# Patient Record
Sex: Female | Born: 1965 | Race: Black or African American | Hispanic: No | Marital: Single | Smoking: Never smoker
Health system: Southern US, Community
[De-identification: ages and names within clinical notes are randomized; demographics above are authoritative.]

## PROBLEM LIST (undated history)

## (undated) DIAGNOSIS — D509 Iron deficiency anemia, unspecified: Secondary | ICD-10-CM

## (undated) DIAGNOSIS — K644 Residual hemorrhoidal skin tags: Secondary | ICD-10-CM

## (undated) DIAGNOSIS — Z9889 Other specified postprocedural states: Secondary | ICD-10-CM

## (undated) DIAGNOSIS — D500 Iron deficiency anemia secondary to blood loss (chronic): Secondary | ICD-10-CM

## (undated) DIAGNOSIS — B9681 Helicobacter pylori [H. pylori] as the cause of diseases classified elsewhere: Secondary | ICD-10-CM

## (undated) DIAGNOSIS — K921 Melena: Secondary | ICD-10-CM

## (undated) DIAGNOSIS — K449 Diaphragmatic hernia without obstruction or gangrene: Secondary | ICD-10-CM

## (undated) DIAGNOSIS — K2950 Unspecified chronic gastritis without bleeding: Secondary | ICD-10-CM

## (undated) HISTORY — DX: Diaphragmatic hernia without obstruction or gangrene: K44.9

## (undated) HISTORY — DX: Iron deficiency anemia, unspecified: D50.9

## (undated) HISTORY — PX: OTHER SURGICAL HISTORY: SHX169

## (undated) HISTORY — DX: Other specified postprocedural states: Z98.890

## (undated) HISTORY — PX: ESOPHAGOGASTRODUODENOSCOPY: SHX1529

---

## 1997-08-31 ENCOUNTER — Ambulatory Visit (HOSPITAL_COMMUNITY): Admission: RE | Admit: 1997-08-31 | Discharge: 1997-08-31 | Payer: Self-pay | Admitting: Gastroenterology

## 1997-11-23 ENCOUNTER — Ambulatory Visit (HOSPITAL_COMMUNITY): Admission: RE | Admit: 1997-11-23 | Discharge: 1997-11-23 | Payer: Self-pay

## 2003-03-16 ENCOUNTER — Ambulatory Visit (HOSPITAL_COMMUNITY): Admission: RE | Admit: 2003-03-16 | Discharge: 2003-03-16 | Payer: Self-pay | Admitting: Internal Medicine

## 2003-03-16 ENCOUNTER — Encounter: Payer: Self-pay | Admitting: Internal Medicine

## 2004-03-26 ENCOUNTER — Ambulatory Visit (HOSPITAL_COMMUNITY): Admission: RE | Admit: 2004-03-26 | Discharge: 2004-03-26 | Payer: Self-pay | Admitting: Family Medicine

## 2004-04-10 ENCOUNTER — Ambulatory Visit (HOSPITAL_COMMUNITY): Admission: RE | Admit: 2004-04-10 | Discharge: 2004-04-10 | Payer: Self-pay | Admitting: Family Medicine

## 2005-04-28 ENCOUNTER — Ambulatory Visit (HOSPITAL_COMMUNITY): Admission: RE | Admit: 2005-04-28 | Discharge: 2005-04-28 | Payer: Self-pay | Admitting: Family Medicine

## 2006-05-08 ENCOUNTER — Ambulatory Visit (HOSPITAL_COMMUNITY): Admission: RE | Admit: 2006-05-08 | Discharge: 2006-05-08 | Payer: Self-pay | Admitting: Family Medicine

## 2006-05-25 ENCOUNTER — Encounter: Admission: RE | Admit: 2006-05-25 | Discharge: 2006-05-25 | Payer: Self-pay | Admitting: Family Medicine

## 2006-05-26 HISTORY — PX: COLONOSCOPY: SHX174

## 2007-04-26 DIAGNOSIS — Z9889 Other specified postprocedural states: Secondary | ICD-10-CM

## 2007-04-26 HISTORY — DX: Other specified postprocedural states: Z98.890

## 2007-04-28 ENCOUNTER — Ambulatory Visit (HOSPITAL_COMMUNITY): Admission: RE | Admit: 2007-04-28 | Discharge: 2007-04-28 | Payer: Self-pay | Admitting: Gastroenterology

## 2008-05-10 ENCOUNTER — Ambulatory Visit (HOSPITAL_COMMUNITY): Admission: RE | Admit: 2008-05-10 | Discharge: 2008-05-10 | Payer: Self-pay | Admitting: Family Medicine

## 2010-06-16 ENCOUNTER — Encounter: Payer: Self-pay | Admitting: Family Medicine

## 2010-10-08 NOTE — Op Note (Signed)
NAMEMARGERT, [name]            ACCOUNT NO.:  [id number]   MEDICAL RECORD NO.:  [id number]          PATIENT TYPE:  AMB   LOCATION:  ENDO                         FACILITY:  [facility]   PHYSICIAN:  Anselmo Rod, M.D.  DATE OF BIRTH:  [birth year]   DATE OF PROCEDURE:  04/28/2007  DATE OF DISCHARGE:  04/28/2007                               OPERATIVE REPORT   PROCEDURE PERFORMED:  Screening colonoscopy.   ENDOSCOPIST:  Anselmo Rod, MD   INSTRUMENT USED:  Pentax video colonoscope.   INDICATIONS FOR PROCEDURE:  A 45 year old [place] American female with a  history of rectal bleeding, undergoing a screening colonoscopy to rule  out colonic polyps, masses, etc.  The patient has a history of  hemorrhoids and chronic constipation.   PREPROCEDURE PREPARATION:  Informed consent was procured from the  patient.  The patient was fasted for 8 hours prior to the procedure and  prepped with a bottle of magnesium citrate and a gallon of NuLytely the  night prior to the procedure.  The risks and benefits of the procedure  including a 10% miss rate of cancer and polyp were discussed with the  patient as well.   PREPROCEDURE PHYSICAL:  VITAL SIGNS:  The patient had stable vital  signs.  NECK:  Supple.  CHEST:  Clear to auscultation.  S1 and S2 regular.  ABDOMEN:  Soft with normal bowel sounds.   DESCRIPTION OF PROCEDURE:  The patient was placed in the left lateral  decubitus position and sedated with 100 mcg of Fentanyl and 10 mg of  Versed given intravenously in slow incremental doses.  Once the patient  was adequately sedated and maintained on low-flow oxygen and continuous  cardiac monitoring, the Pentax video colonoscope was advanced from the  rectum to the cecum.  Multiple washes were done.  The patient had an  excellent prep.  No masses, polyps, erosions, ulcerations or diverticula  were seen.  The appendiceal orifice and cecal valve were clearly  visualized and photographed.   Retroflexion in the rectum revealed  inflamed internal hemorrhoids; small external hemorrhoids were seen as  well.  The patient tolerated the procedure well without complications.   IMPRESSION:  Normal colonoscopy of the terminal ileum, except for small  internal and external hemorrhoids, which I suspect is the source of the  patient's rectal bleeding.   RECOMMENDATIONS:  1. Anusol suppositories have been called into the patient's pharmacy      and Enalapril cream has been given to her to apply around the anus      3 times a day.  Further recommendation to be made in followup in      the next 4 weeks.  2. A high-fiber diet with liberal fluid intake has been advocated.  3. Use of stool softeners and MiraLax is advised as needed.  4. Further recommendations to be made in followup.      Anselmo Rod, M.D.  Electronically Signed     JNM/MEDQ  D:  04/30/2007  T:  04/30/2007  Job:  [id number]   cc:   Della Goo, M.D.  Fax: [phone]

## 2011-02-14 ENCOUNTER — Emergency Department (HOSPITAL_COMMUNITY): Payer: Self-pay

## 2011-02-14 ENCOUNTER — Inpatient Hospital Stay (HOSPITAL_COMMUNITY)
Admission: EM | Admit: 2011-02-14 | Discharge: 2011-02-16 | DRG: 812 | Disposition: A | Discharge status: Home / Self Care | Payer: Self-pay | Attending: Internal Medicine | Admitting: Internal Medicine

## 2011-02-14 ENCOUNTER — Encounter: Payer: Self-pay | Admitting: *Deleted

## 2011-02-14 DIAGNOSIS — K644 Residual hemorrhoidal skin tags: Secondary | ICD-10-CM | POA: Diagnosis present

## 2011-02-14 DIAGNOSIS — D649 Anemia, unspecified: Secondary | ICD-10-CM

## 2011-02-14 DIAGNOSIS — E669 Obesity, unspecified: Secondary | ICD-10-CM

## 2011-02-14 DIAGNOSIS — K648 Other hemorrhoids: Secondary | ICD-10-CM | POA: Diagnosis present

## 2011-02-14 DIAGNOSIS — J45909 Unspecified asthma, uncomplicated: Secondary | ICD-10-CM | POA: Diagnosis not present

## 2011-02-14 DIAGNOSIS — Z791 Long term (current) use of non-steroidal anti-inflammatories (NSAID): Secondary | ICD-10-CM

## 2011-02-14 DIAGNOSIS — D500 Iron deficiency anemia secondary to blood loss (chronic): Principal | ICD-10-CM | POA: Diagnosis present

## 2011-02-14 DIAGNOSIS — K922 Gastrointestinal hemorrhage, unspecified: Secondary | ICD-10-CM

## 2011-02-14 LAB — APTT: aPTT: 40 seconds — ABNORMAL HIGH (ref 24–37)

## 2011-02-14 LAB — HEPATIC FUNCTION PANEL
ALT: 7 U/L (ref 0–35)
Albumin: 3.8 g/dL (ref 3.5–5.2)
Bilirubin, Direct: 0.1 mg/dL (ref 0.0–0.3)
Indirect Bilirubin: 0.1 mg/dL — ABNORMAL LOW (ref 0.3–0.9)
Total Protein: 7.2 g/dL (ref 6.0–8.3)

## 2011-02-14 LAB — BASIC METABOLIC PANEL
BUN: 7 mg/dL (ref 6–23)
CO2: 24 mEq/L (ref 19–32)
Chloride: 106 mEq/L (ref 96–112)
Creatinine, Ser: 0.87 mg/dL (ref 0.50–1.10)

## 2011-02-14 LAB — RETICULOCYTES: Retic Count, Absolute: 46.6 10*3/uL (ref 19.0–186.0)

## 2011-02-14 LAB — CBC
HCT: 17.4 % — ABNORMAL LOW (ref 36.0–46.0)
MCH: 15 pg — ABNORMAL LOW (ref 26.0–34.0)
MCV: 55.4 fL — ABNORMAL LOW (ref 78.0–100.0)
Platelets: 280 10*3/uL (ref 150–400)
RBC: 3.14 MIL/uL — ABNORMAL LOW (ref 3.87–5.11)
RDW: 20.8 % — ABNORMAL HIGH (ref 11.5–15.5)
WBC: 5.4 10*3/uL (ref 4.0–10.5)

## 2011-02-14 LAB — DIFFERENTIAL
Eosinophils Relative: 2 % (ref 0–5)
Monocytes Absolute: 0.3 10*3/uL (ref 0.1–1.0)

## 2011-02-14 LAB — OCCULT BLOOD, POC DEVICE: Fecal Occult Bld: NEGATIVE

## 2011-02-14 MED ORDER — ONDANSETRON HCL 4 MG/2ML IJ SOLN: 4.0000 mg | Freq: Four times a day (QID) | INTRAMUSCULAR | Status: DC | PRN

## 2011-02-14 MED ORDER — BISACODYL 10 MG RE SUPP: 10.0000 mg | RECTAL | Status: DC | PRN

## 2011-02-14 MED ORDER — ACETAMINOPHEN 650 MG RE SUPP: 650.0000 mg | Freq: Four times a day (QID) | RECTAL | Status: DC | PRN

## 2011-02-14 MED ORDER — ACETAMINOPHEN 325 MG PO TABS
650.0000 mg | ORAL_TABLET | Freq: Four times a day (QID) | ORAL | Status: DC | PRN
  Administered 2011-02-15 – 2011-02-16 (×3): 650 mg via ORAL
  Filled 2011-02-14 (×3): qty 2

## 2011-02-14 MED ORDER — FLEET ENEMA 7-19 GM/118ML RE ENEM: 1.0000 | ENEMA | RECTAL | Status: DC | PRN

## 2011-02-14 MED ORDER — ONDANSETRON HCL 4 MG PO TABS: 4.0000 mg | ORAL_TABLET | Freq: Four times a day (QID) | ORAL | Status: DC | PRN

## 2011-02-14 MED ORDER — TRAZODONE HCL 50 MG PO TABS
25.0000 mg | ORAL_TABLET | Freq: Every evening | ORAL | Status: DC | PRN
  Filled 2011-02-14: qty 1

## 2011-02-14 MED ORDER — SODIUM CHLORIDE 0.9 % IV SOLN: INTRAVENOUS | Status: DC

## 2011-02-14 MED ORDER — ASPIRIN EC 81 MG PO TBEC
81.0000 mg | DELAYED_RELEASE_TABLET | Freq: Every day | ORAL | Status: DC
  Administered 2011-02-15 – 2011-02-16 (×2): 81 mg via ORAL
  Filled 2011-02-14 (×2): qty 1

## 2011-02-14 MED ORDER — POLYETHYLENE GLYCOL 3350 17 G PO PACK: 17.0000 g | PACK | Freq: Every day | ORAL | Status: DC | PRN

## 2011-02-14 NOTE — ED Notes (Signed)
Critical hemoglobin 4.3 called in by [facility] in lab.

## 2011-02-14 NOTE — H&P (Signed)
PCP:   No primary provider on file.   Chief Complaint:  Episodic rectal bleeding and pain.  HPI: 45 year old obese African American lady, who has high episodic rectal bleeding for over 3 years, who had a colonoscopy with Dr. Loreta Ave 3 years ago, and was reportedly told that it was on the piles but in fact did not go back for followup, because of financial reasons. He does not take iron supplements, but does take some type of pain medication at least once per week, typically ibuprofen, Aleve or Goody's powders.  She has never noted black or tarry stools; she has not menstruated for about 4 years; she has had progressive dyspnea on exertion but no leg edema no chest pains or palpitations.  In the emergency room patient was evaluated and found her hemoglobin is 4.7 , and the hospitalist service was called to assist with management.  Review of Systems:  The patient denies anorexia, fever, weight loss,, vision loss, decreased hearing, hoarseness, chest pain, syncope, , peripheral edema, balance deficits, hemoptysis, abdominal pain, melena, hematochezia, severe indigestion/heartburn, hematuria, incontinence, genital sores, muscle weakness, suspicious skin lesions, transient blindness, difficulty walking, depression, unusual weight change,  enlarged lymph nodes, angioedema, and breast masses.  Past Medical History: Past Medical History  Diagnosis Date  . Asthma    History reviewed. No pertinent past surgical history.  Medications: Prior to Admission medications   Medication Sig Start Date End Date Taking? Authorizing Provider  Aspirin-Caffeine (BC FAST PAIN RELIEF PO) Take 1 packet by mouth every 4 (four) hours as needed. Pain    Yes Historical Provider, MD  diphenhydramine-acetaminophen (TYLENOL PM) 25-500 MG TABS Take 1 tablet by mouth at bedtime as needed. Sleep/Pain    Yes Historical Provider, MD  hydrocortisone (PREPARATION H HYDROCORTISONE) 1 % cream Apply 1 application topically 2 (two)  times daily as needed. Pain    Yes Historical Provider, MD  shark liver oil-cocoa butter (HEMORRHOIDAL) 0.25-3-85.5 % suppository Place 1 suppository rectally as needed. Hemorrhoidal Pain    Yes Historical Provider, MD    Allergies:   Allergies  Allergen Reactions  . Penicillins     Social History:  reports that she has never smoked. She does not have any smokeless tobacco history on file. She reports that she does not drink alcohol or use illicit drugs.  Family History: History reviewed. No pertinent family history.  Physical Exam: Filed Vitals:   02/14/11 1430 02/14/11 1434 02/14/11 1818  BP:  139/68 122/51  Pulse:  96 71  Temp:  98.7 F (37.1 C)   TempSrc:  Oral   Resp:  20 16  Height: 5\' 10"  (1.778 m)    Weight: 108.863 kg (240 lb)    SpO2:  100% 98%    Pleasant young-looking African American lady lying in bed does not appear acutely distressed Gross membranes are markedly pale, anicteric, pupils round equal and reactive, extraocular muscles intact No thyromegaly or carotid bruit no cervical lymphadenopathy Chest is clear to auscultation bilaterally, 2/6 systolic murmur regular rhythm No chest wall tenderness Abdomen is obese soft nontender no masses Extremities she has genu valgus otherwise unremarkable Skin is free from blemishes or bruising. Central nervous system grossly intact no lateralizing signs   Labs on Admission:   [facility] 02/14/11 1711  NA 138  K 3.8  CL 106  CO2 24  GLUCOSE 97  BUN 7  CREATININE 0.87  CALCIUM 9.2  MG --  PHOS --     Basename 02/14/11 1846 02/14/11 1711  WBC -- 5.4  NEUTROABS -- 3.1  HGB 4.3* 4.7*  HCT 16.0* 17.4*  MCV -- 55.4*  PLT -- 280   No results found for this basename: CKTOTAL:3,CKMB:3,CKMBINDEX:3,TROPONINI:3 in the last 72 hours No results found for this basename: TSH,T4TOTAL,FREET3,T3FREE,THYROIDAB in the last 72 hours  Basename 02/14/11 2107  VITAMINB12 --  FOLATE --  FERRITIN --  TIBC --  IRON --    RETICCTPCT 1.6    Radiological Exams on Admission: No results found.  Pending  Assessment/Plan Present on Admission:  .Anemia .Obesity  Severe microcytic anemia without marked symptoms suggest a chronic ongoing bleed likely the result of her chronic rectal bleeding. He possibly be aggravated by herNSAID use.  Will admit for transfusion of 5 units of packed red cells after anemia workup and consult gastroenterologist for assistance with management.  Other plans as per orders.   Eldredge Veldhuizen 02/14/2011, 10:08 PM

## 2011-02-14 NOTE — ED Notes (Signed)
Pt c/o vaginal bleeding, rectal swelling and rectal pain x 2 weeks. Pt states that she has hemorrhoids and has been using OTC medicine but it has not helped. Painful to pass stool.

## 2011-02-14 NOTE — ED Notes (Signed)
Critical Hgb called over of 14.7. Dr. Fredricka Bonine made aware. New order received to recheck Hgb.

## 2011-02-14 NOTE — ED Provider Notes (Signed)
History   Scribed for Felisa Bonier, MD, the patient was seen in room APA07/APA07. This chart was scribed by Clarita Crane. This patient's care was started at 4:42PM.  CSN: [id number] Arrival date & time: 02/14/2011  2:58 PM  Chief Complaint  Patient presents with  . Rectal Pain   HPI   HPI Traci [name] is a 45 y.o. female who presents to the Emergency Department complaining of rectal pain onset yesterday after administration of suppository and persistent since with associated weakness since yesterday which has improved today. Describes pain as "like a knot" in her rectum. Patient also notes she has experienced rectal bleeding onset 3 weeks ago and persistent since until resolved 1 week ago after administration of suppository. States bleeding is "like water" from anus. Patient reports having a significant history of hemorrhoids for which she was followed by Dr. Precious Bard for who advised patient to use stool softeners for aid which she has been compliant with. H/o prolapsed uterus.  PCP- Dr. Loreta Ave in [place]   HPI ELEMENTS: Location: rectum  Onset: yesterday Duration: persistent since onset  Timing: constant  Quality: "like a knot" in her rectum    Context:  as above  Associated symptoms: +weakness, rectal bleeding for 2 weeks prior to rectal pain but resolved prior to onset of rectal pain.    PAST MEDICAL HISTORY:  Past Medical History  Diagnosis Date  . Asthma     PAST SURGICAL HISTORY:  History reviewed. No pertinent past surgical history.  FAMILY HISTORY:  History reviewed. No pertinent family history.   SOCIAL HISTORY: History   Social History  . Marital Status: Single    Spouse Name: N/A    Number of Children: N/A  . Years of Education: N/A   Social History Main Topics  . Smoking status: Never Smoker   . Smokeless tobacco: None  . Alcohol Use: No  . Drug Use: No  . Sexually Active: Yes    Birth Control/ Protection: None   Other Topics Concern  . None     Social History Narrative  . None      Review of Systems  Review of Systems 10 Systems reviewed and are negative for acute change except as noted in the HPI.  Allergies  Penicillins  Home Medications   Current Outpatient Rx  Name Route Sig Dispense Refill  . BC FAST PAIN RELIEF PO Oral Take 1 packet by mouth every 4 (four) hours as needed. Pain     . DIPHENHYDRAMINE-APAP (SLEEP) 25-500 MG PO TABS Oral Take 1 tablet by mouth at bedtime as needed. Sleep/Pain     . HYDROCORTISONE 1 % EX CREA Topical Apply 1 application topically 2 (two) times daily as needed. Pain     . PE-SHARK LIVER OIL-COCOA BUTTR 0.25-3-85.5 % RE SUPP Rectal Place 1 suppository rectally as needed. Hemorrhoidal Pain       Physical Exam    BP 122/51  Pulse 71  Temp(Src) 98.7 F (37.1 C) (Oral)  Resp 16  Ht 5\' 10"  (1.778 m)  Wt 240 lb (108.863 kg)  BMI 34.44 kg/m2  SpO2 98%  Physical Exam  Nursing note and vitals reviewed. Constitutional: She is oriented to person, place, and time. She appears well-developed and well-nourished.  HENT:  Head: Normocephalic and atraumatic.       Moist mucous membranes.   Eyes: EOM are normal. Pupils are equal, round, and reactive to light.       Conjunctiva slightly pale.   Neck: Neck  supple.  Cardiovascular: Normal rate, regular rhythm, S1 normal, S2 normal and normal heart sounds.   No murmur heard. Pulmonary/Chest: Effort normal and breath sounds normal. She has no wheezes. She has no rales.  Abdominal: Soft. Bowel sounds are normal. She exhibits no distension. There is no tenderness.  Genitourinary:       Female chaperone present for exam. Inflamed hemorrhoid noted.   Musculoskeletal: Normal range of motion. She exhibits no edema.  Neurological: She is alert and oriented to person, place, and time. No sensory deficit.  Skin: Skin is warm and dry.  Psychiatric: She has a normal mood and affect. Her behavior is normal.    ED Course  Procedures  OTHER DATA  REVIEWED: Nursing notes, vital signs, and past medical records reviewed. Lab results reviewed and considered Imaging results reviewed and considered  DIAGNOSTIC STUDIES:   LABS / RADIOLOGY: Results for orders placed during the hospital encounter of 02/14/11  CBC      Component Value Range   WBC 5.4  4.0 - 10.5 (K/uL)   RBC 3.14 (*) 3.87 - 5.11 (MIL/uL)   Hemoglobin 4.7 (*) 12.0 - 15.0 (g/dL)   HCT 78.2 (*) 95.6 - 46.0 (%)   MCV 55.4 (*) 78.0 - 100.0 (fL)   MCH 15.0 (*) 26.0 - 34.0 (pg)   MCHC 27.0 (*) 30.0 - 36.0 (g/dL)   RDW 21.3 (*) 08.6 - 15.5 (%)   Platelets 280  150 - 400 (K/uL)  DIFFERENTIAL      Component Value Range   Neutrophils Relative 57  43 - 77 (%)   Neutro Abs 3.1  1.7 - 7.7 (K/uL)   Lymphocytes Relative 36  12 - 46 (%)   Lymphs Abs 1.9  0.7 - 4.0 (K/uL)   Monocytes Relative 5  3 - 12 (%)   Monocytes Absolute 0.3  0.1 - 1.0 (K/uL)   Eosinophils Relative 2  0 - 5 (%)   Eosinophils Absolute 0.1  0.0 - 0.7 (K/uL)   Basophils Relative 1  0 - 1 (%)   Basophils Absolute 0.0  0.0 - 0.1 (K/uL)  BASIC METABOLIC PANEL      Component Value Range   Sodium 138  135 - 145 (mEq/L)   Potassium 3.8  3.5 - 5.1 (mEq/L)   Chloride 106  96 - 112 (mEq/L)   CO2 24  19 - 32 (mEq/L)   Glucose, Bld 97  70 - 99 (mg/dL)   BUN 7  6 - 23 (mg/dL)   Creatinine, Ser 5.78  0.50 - 1.10 (mg/dL)   Calcium 9.2  8.4 - 46.9 (mg/dL)   GFR calc non Af Amer >60  >60 (mL/min)   GFR calc Af Amer >60  >60 (mL/min)  PROTIME-INR      Component Value Range   Prothrombin Time 12.9  11.6 - 15.2 (seconds)   INR 0.95  0.00 - 1.49   APTT      Component Value Range   aPTT 40 (*) 24 - 37 (seconds)  OCCULT BLOOD, POC DEVICE      Component Value Range   Fecal Occult Bld NEGATIVE    HEMOGLOBIN AND HEMATOCRIT, BLOOD      Component Value Range   Hemoglobin 4.3 (*) 12.0 - 15.0 (g/dL)   HCT 62.9 (*) 52.8 - 46.0 (%)  TYPE AND SCREEN      Component Value Range   ABO/RH(D) O POS     Antibody Screen NEG       Sample  Expiration 02/17/2011     Unit Number [id number]     Blood Component Type RED CELLS,LR     Unit division 00     Status of Unit ALLOCATED     Transfusion Status OK TO TRANSFUSE     Crossmatch Result Compatible     Unit Number [id number]     Blood Component Type RED CELLS,LR     Unit division 00     Status of Unit ALLOCATED     Transfusion Status OK TO TRANSFUSE     Crossmatch Result Compatible     Unit Number [id number]     Blood Component Type RED CELLS,LR     Unit division 00     Status of Unit ALLOCATED     Transfusion Status OK TO TRANSFUSE     Crossmatch Result Compatible    PREPARE RBC (CROSSMATCH)      Component Value Range   Order Confirmation ORDER PROCESSED BY BLOOD BANK    ABO/RH      Component Value Range   ABO/RH(D) O POS     No results found.  ED COURSE / COORDINATION OF CARE: Orders Placed This Encounter  Procedures  . CBC  . Differential  . Basic metabolic panel  . Protime-INR  . APTT  . Hemoglobin and hematocrit, blood  . Consult to hospitalist  . Occult blood, poc device  . Type and screen  . Prepare RBC  . ABO/Rh  4:58PM- Patient now denies vaginal bleeding noting she misunderstood initial question. Wet prep and Transvaginal US orders cancelled as a result.  6:15PM- Patient informed of critically low hemoglobin level and intent to admit for blood transfusion. Patient agrees with plan set forth at this time. Will obtain a secondary lab result to confirm need for transfusion first. 9:08PM- Consult complete with Hospitalist. Patient case explained and discussed. Hospitalist agrees to admit patient for further evaluation and treatment.   MDM: Differential Diagnosis:   PLAN: Admit  CONDITION ON Admission: Stable  DIAGNOSIS: No diagnosis found.   MEDICATIONS GIVEN IN THE E.D.  Medications  diphenhydramine-acetaminophen (TYLENOL PM) 25-500 MG TABS (not administered)  Aspirin-Caffeine (BC FAST PAIN RELIEF PO) (not administered)   hydrocortisone (PREPARATION H HYDROCORTISONE) 1 % cream (not administered)  shark liver oil-cocoa butter (HEMORRHOIDAL) 0.25-3-85.5 % suppository (not administered)      I personally performed the services described in this documentation, which was scribed in my presence. The recorded information has been reviewed and considered.    Felisa Bonier, MD 03/14/11 [phone]

## 2011-02-14 NOTE — ED Notes (Signed)
Pt to room. Pt stats hemorrhoids are "hanging out". Pt states bleeding is coming form hemorrhoids. NAD at this time.

## 2011-02-14 NOTE — ED Notes (Signed)
Pt taken upstairs, report given to [place], rn

## 2011-02-15 LAB — CBC
MCHC: 29.9 g/dL — ABNORMAL LOW (ref 30.0–36.0)
RDW: 31.9 % — ABNORMAL HIGH (ref 11.5–15.5)

## 2011-02-15 LAB — CARDIAC PANEL(CRET KIN+CKTOT+MB+TROPI): Troponin I: <0.3 ng/mL (ref <0.30)

## 2011-02-15 LAB — BASIC METABOLIC PANEL
BUN: 5 mg/dL — ABNORMAL LOW (ref 6–23)
Calcium: 9 mg/dL (ref 8.4–10.5)
Creatinine, Ser: 0.76 mg/dL (ref 0.50–1.10)
GFR calc Af Amer: >60 mL/min (ref >60)
GFR calc non Af Amer: >60 mL/min (ref >60)

## 2011-02-15 LAB — URINALYSIS, ROUTINE W REFLEX MICROSCOPIC
Bilirubin Urine: NEGATIVE
Hgb urine dipstick: NEGATIVE
Specific Gravity, Urine: 1.01 (ref 1.005–1.030)
Urobilinogen, UA: 0.2 mg/dL (ref 0.0–1.0)
pH: 6 (ref 5.0–8.0)

## 2011-02-15 LAB — IRON AND TIBC: UIBC: 434 ug/dL — ABNORMAL HIGH (ref 125–400)

## 2011-02-15 LAB — PREPARE RBC (CROSSMATCH)

## 2011-02-15 MED ORDER — FUROSEMIDE 10 MG/ML IJ SOLN: 20.0000 mg | INTRAMUSCULAR | Status: DC | PRN

## 2011-02-15 MED ORDER — SODIUM CHLORIDE 0.9 % IJ SOLN
INTRAMUSCULAR | Status: AC
  Filled 2011-02-15: qty 10

## 2011-02-15 NOTE — Progress Notes (Signed)
Subjective: This for a pleasant 45 year old lady came in with severe microcytic anemia with a hemoglobin of 4.7! She apparently felt slightly tired. She has had significant rectal bleeding in the last week or so. She does have a history of internal and external hemorrhoids shown on colonoscopy a few years ago. She denies any nausea or vomiting, in particular no history of melena or hematemesis. She is already had 3 units of blood so far. She is due to have 2 further units today.           Physical Exam: Blood pressure 109/65 pulse 71, temperature 97.8 F (36.6 C), temperature source Oral, resp. rate 17, height 5\' 10"  (1.778 m), weight 111 kg (244 lb 11.4 oz), SpO2 99.00%. She looks systemically well and despite the blood computerized pressure recordings, her blood pressure is at acceptable range and she is hemodynamically stable. She is alert and orientated. Heart sounds are present and normal. Lung fields are clear. She is not clinically shock.   Investigations:  Basename 02/14/11 1846 02/14/11 1711  WBC -- 5.4  NEUTROABS -- 3.1  HGB 4.3* 4.7*  HCT 16.0* 17.4*  MCV -- 55.4*  PLT -- 280    Basename 02/14/11 1711  NA 138  K 3.8  CL 106  CO2 24  GLUCOSE 97  BUN 7  CREATININE 0.87  CALCIUM 9.2  MG --  PHOS --   Recent Results (from the past 240 hour(s))  MRSA PCR SCREENING     Status: Normal   Collection Time   02/15/11 12:35 AM      Component Value Range Status Comment   MRSA by PCR NEGATIVE  NEGATIVE  Final     X-ray Chest Pa And Lateral   02/15/2011  *RADIOLOGY REPORT*  Clinical Data: Severe anemia.  CHEST - 2 VIEW  Comparison: None.  Findings: The lungs are well-aerated; there is mild elevation of the left hemidiaphragm.  There is no evidence of focal opacification, pleural effusion or pneumothorax.  The heart is borderline normal in size; the mediastinal contour is within normal limits.  No acute osseous abnormalities are seen.  IMPRESSION: No acute cardiopulmonary  process seen; mild elevation of the left hemidiaphragm, likely transient in nature.  Original Report Authenticated By: Tonia Ghent, M.D.      Medications: I have reviewed the patient's current medications.  Impression: 1. Acute GI bleed secondary to hemorrhoids. 2. Severe microcytic anemia secondary to #1, status post 3 units blood transfusion so far.     Plan: 1. Continue with plan of further 2 units of blood transfusion. 2. Await gastroenterology consultation.     LOS: 1 day   GOSRANI,NIMISH C 02/15/2011, 9:34 AM

## 2011-02-16 LAB — TYPE AND SCREEN
Unit division: 0
Unit division: 0
Unit division: 0

## 2011-02-16 LAB — CBC
Platelets: 293 10*3/uL (ref 150–400)
RDW: 31.1 % — ABNORMAL HIGH (ref 11.5–15.5)
WBC: 7 10*3/uL (ref 4.0–10.5)

## 2011-02-16 LAB — COMPREHENSIVE METABOLIC PANEL
AST: 9 U/L (ref 0–37)
Albumin: 3.5 g/dL (ref 3.5–5.2)
Chloride: 106 mEq/L (ref 96–112)
Creatinine, Ser: 0.75 mg/dL (ref 0.50–1.10)
Potassium: 3.7 mEq/L (ref 3.5–5.1)
Total Bilirubin: 0.5 mg/dL (ref 0.3–1.2)
Total Protein: 6.8 g/dL (ref 6.0–8.3)

## 2011-02-16 LAB — URINE CULTURE
Colony Count: 4000
Culture  Setup Time: 201209222030

## 2011-02-16 MED ORDER — FERROUS SULFATE 325 (65 FE) MG PO TBEC: 325.0000 mg | DELAYED_RELEASE_TABLET | Freq: Two times a day (BID) | ORAL | Status: DC

## 2011-02-16 MED ORDER — LOPERAMIDE HCL 2 MG PO CAPS
2.0000 mg | ORAL_CAPSULE | ORAL | Status: DC | PRN
  Administered 2011-02-16: 2 mg via ORAL
  Filled 2011-02-16: qty 1

## 2011-02-16 MED ORDER — DOCUSATE SODIUM 100 MG PO CAPS: 100.0000 mg | ORAL_CAPSULE | Freq: Two times a day (BID) | ORAL | Status: AC

## 2011-02-16 NOTE — Discharge Summary (Signed)
Physician Discharge Summary  Patient ID: Traci [name] MRN: [id number] DOB/AGE: [birth year] 45 y.o.  Admit date: 02/14/2011 Discharge date: 02/16/2011    Discharge Diagnoses:  1. Chronic gastrointestinal blood loss microcytic anemia, status post 5 units blood transfusion. No active bleeding. 2. Asthma, stable.   Current Discharge Medication List    START taking these medications   Details  docusate sodium (COLACE) 100 MG capsule Take 1 capsule (100 mg total) by mouth 2 (two) times daily. Qty: 60 capsule, Refills: 0    ferrous sulfate 325 (65 FE) MG EC tablet Take 1 tablet (325 mg total) by mouth 2 (two) times daily with a meal. Qty: 90 tablet, Refills: 0      CONTINUE these medications which have NOT CHANGED   Details  diphenhydramine-acetaminophen (TYLENOL PM) 25-500 MG TABS Take 1 tablet by mouth at bedtime as needed. Sleep/Pain     hydrocortisone (PREPARATION H HYDROCORTISONE) 1 % cream Apply 1 application topically 2 (two) times daily as needed. Pain     shark liver oil-cocoa butter (HEMORRHOIDAL) 0.25-3-85.5 % suppository Place 1 suppository rectally as needed. Hemorrhoidal Pain       STOP taking these medications     Aspirin-Caffeine (BC FAST PAIN RELIEF PO)         Discharged Condition: Improved and stable.    Consults: None.  Significant Diagnostic Studies: X-ray Chest Pa And Lateral   02/15/2011  *RADIOLOGY REPORT*  Clinical Data: Severe anemia.  CHEST - 2 VIEW  Comparison: None.  Findings: The lungs are well-aerated; there is mild elevation of the left hemidiaphragm.  There is no evidence of focal opacification, pleural effusion or pneumothorax.  The heart is borderline normal in size; the mediastinal contour is within normal limits.  No acute osseous abnormalities are seen.  IMPRESSION: No acute cardiopulmonary process seen; mild elevation of the left hemidiaphragm, likely transient in nature.  Original Report Authenticated By: Tonia Ghent, M.D.    Lab  Results: Results for orders placed during the hospital encounter of 02/14/11 (from the past 48 hour(s))  OCCULT BLOOD, POC DEVICE     Status: Normal   Collection Time   02/14/11  5:08 PM      Component Value Range Comment   Fecal Occult Bld NEGATIVE     CBC     Status: Abnormal   Collection Time   02/14/11  5:11 PM      Component Value Range Comment   WBC 5.4  4.0 - 10.5 (K/uL)    RBC 3.14 (*) 3.87 - 5.11 (MIL/uL)    Hemoglobin 4.7 (*) 12.0 - 15.0 (g/dL)    HCT 40.9 (*) 81.1 - 46.0 (%)    MCV 55.4 (*) 78.0 - 100.0 (fL)    MCH 15.0 (*) 26.0 - 34.0 (pg)    MCHC 27.0 (*) 30.0 - 36.0 (g/dL)    RDW 91.4 (*) 78.2 - 15.5 (%)    Platelets 280  150 - 400 (K/uL)   DIFFERENTIAL     Status: Normal   Collection Time   02/14/11  5:11 PM      Component Value Range Comment   Neutrophils Relative 57  43 - 77 (%)    Neutro Abs 3.1  1.7 - 7.7 (K/uL)    Lymphocytes Relative 36  12 - 46 (%)    Lymphs Abs 1.9  0.7 - 4.0 (K/uL)    Monocytes Relative 5  3 - 12 (%)    Monocytes Absolute 0.3  0.1 - 1.0 (K/uL)  Eosinophils Relative 2  0 - 5 (%)    Eosinophils Absolute 0.1  0.0 - 0.7 (K/uL)    Basophils Relative 1  0 - 1 (%)    Basophils Absolute 0.0  0.0 - 0.1 (K/uL)   BASIC METABOLIC PANEL     Status: Normal   Collection Time   02/14/11  5:11 PM      Component Value Range Comment   Sodium 138  135 - 145 (mEq/L)    Potassium 3.8  3.5 - 5.1 (mEq/L)    Chloride 106  96 - 112 (mEq/L)    CO2 24  19 - 32 (mEq/L)    Glucose, Bld 97  70 - 99 (mg/dL)    BUN 7  6 - 23 (mg/dL)    Creatinine, Ser 4.54  0.50 - 1.10 (mg/dL)    Calcium 9.2  8.4 - 10.5 (mg/dL)    GFR calc non Af Amer >60  >60 (mL/min)    GFR calc Af Amer >60  >60 (mL/min)   PROTIME-INR     Status: Normal   Collection Time   02/14/11  5:11 PM      Component Value Range Comment   Prothrombin Time 12.9  11.6 - 15.2 (seconds)    INR 0.95  0.00 - 1.49    APTT     Status: Abnormal   Collection Time   02/14/11  5:11 PM      Component Value Range  Comment   aPTT 40 (*) 24 - 37 (seconds)   HEPATIC FUNCTION PANEL     Status: Abnormal   Collection Time   02/14/11  5:11 PM      Component Value Range Comment   Total Protein 7.2  6.0 - 8.3 (g/dL)    Albumin 3.8  3.5 - 5.2 (g/dL)    AST 11  0 - 37 (U/L)    ALT 7  0 - 35 (U/L)    Alkaline Phosphatase 62  39 - 117 (U/L)    Total Bilirubin 0.2 (*) 0.3 - 1.2 (mg/dL)    Bilirubin, Direct 0.1  0.0 - 0.3 (mg/dL)    Indirect Bilirubin 0.1 (*) 0.3 - 0.9 (mg/dL)   HEMOGLOBIN AND HEMATOCRIT, BLOOD     Status: Abnormal   Collection Time   02/14/11  6:46 PM      Component Value Range Comment   Hemoglobin 4.3 (*) 12.0 - 15.0 (g/dL)    HCT 09.8 (*) 11.9 - 46.0 (%)   ABO/RH     Status: Normal   Collection Time   02/14/11  6:46 PM      Component Value Range Comment   ABO/RH(D) O POS     TYPE AND SCREEN     Status: Normal   Collection Time   02/14/11  7:18 PM      Component Value Range Comment   ABO/RH(D) O POS      Antibody Screen NEG      Sample Expiration 02/17/2011      Unit Number [id number]      Blood Component Type RED CELLS,LR      Unit division 00      Status of Unit ISSUED,FINAL      Transfusion Status OK TO TRANSFUSE      Crossmatch Result Compatible      Unit Number [id number]      Blood Component Type RED CELLS,LR      Unit division 00      Status of Unit ISSUED,FINAL  Transfusion Status OK TO TRANSFUSE      Crossmatch Result Compatible      Unit Number [id number]      Blood Component Type RED CELLS,LR      Unit division 00      Status of Unit ISSUED,FINAL      Transfusion Status OK TO TRANSFUSE      Crossmatch Result Compatible      Unit Number [id number]      Blood Component Type RED CELLS,LR      Unit division 00      Status of Unit ISSUED,FINAL      Transfusion Status OK TO TRANSFUSE      Crossmatch Result Compatible      Unit Number [id number]      Blood Component Type RED CELLS,LR      Unit division 00      Status of Unit ISSUED,FINAL      Transfusion Status OK  TO TRANSFUSE      Crossmatch Result Compatible     PREPARE RBC (CROSSMATCH)     Status: Normal   Collection Time   02/14/11  7:19 PM      Component Value Range Comment   Order Confirmation ORDER PROCESSED BY BLOOD BANK     VITAMIN B12     Status: Normal   Collection Time   02/14/11  9:07 PM      Component Value Range Comment   Vitamin B-12 348  211 - 911 (pg/mL)   FOLATE     Status: Normal   Collection Time   02/14/11  9:07 PM      Component Value Range Comment   Folate 11.5     IRON AND TIBC     Status: Abnormal   Collection Time   02/14/11  9:07 PM      Component Value Range Comment   Iron <10 (*) 42 - 135 (ug/dL)    TIBC Not calculated due to Iron <10.  250 - 470 (ug/dL)    Saturation Ratios Not calculated due to Iron <10.  20 - 55 (%)    UIBC 434 (*) 125 - 400 (ug/dL)   FERRITIN     Status: Abnormal   Collection Time   02/14/11  9:07 PM      Component Value Range Comment   Ferritin 1 (*) 10 - 291 (ng/mL)   RETICULOCYTES     Status: Abnormal   Collection Time   02/14/11  9:07 PM      Component Value Range Comment   Retic Ct Pct 1.6  0.4 - 3.1 (%)    RBC. 2.91 (*) 3.87 - 5.11 (MIL/uL)    Retic Count, Manual 46.6  19.0 - 186.0 (K/uL)   CARDIAC PANEL(CRET KIN+CKTOT+MB+TROPI)     Status: Normal   Collection Time   02/14/11 10:59 PM      Component Value Range Comment   Total CK 121  7 - 177 (U/L)    CK, MB 2.2  0.3 - 4.0 (ng/mL)    Troponin I <0.30  <0.30 (ng/mL)    Relative Index 1.8  0.0 - 2.5    MRSA PCR SCREENING     Status: Normal   Collection Time   02/15/11 12:35 AM      Component Value Range Comment   MRSA by PCR NEGATIVE  NEGATIVE    URINALYSIS, ROUTINE W REFLEX MICROSCOPIC     Status: Normal   Collection Time   02/15/11  1:00 AM  Component Value Range Comment   Color, Urine YELLOW  YELLOW     Appearance CLEAR  CLEAR     Specific Gravity, Urine 1.010  1.005 - 1.030     pH 6.0  5.0 - 8.0     Glucose, UA NEGATIVE  NEGATIVE (mg/dL)    Hgb urine dipstick  NEGATIVE  NEGATIVE     Bilirubin Urine NEGATIVE  NEGATIVE     Ketones, ur NEGATIVE  NEGATIVE (mg/dL)    Protein, ur NEGATIVE  NEGATIVE (mg/dL)    Urobilinogen, UA 0.2  0.0 - 1.0 (mg/dL)    Nitrite NEGATIVE  NEGATIVE     Leukocytes, UA NEGATIVE  NEGATIVE  MICROSCOPIC NOT DONE ON URINES WITH NEGATIVE PROTEIN, BLOOD, LEUKOCYTES, NITRITE, OR GLUCOSE <1000 mg/dL.  PREPARE RBC (CROSSMATCH)     Status: Normal   Collection Time   02/15/11  8:30 AM      Component Value Range Comment   Order Confirmation ORDER PROCESSED BY BLOOD BANK     BASIC METABOLIC PANEL     Status: Abnormal   Collection Time   02/15/11  9:19 AM      Component Value Range Comment   Sodium 140  135 - 145 (mEq/L)    Potassium 3.7  3.5 - 5.1 (mEq/L)    Chloride 107  96 - 112 (mEq/L)    CO2 26  19 - 32 (mEq/L)    Glucose, Bld 84  70 - 99 (mg/dL)    BUN 5 (*) 6 - 23 (mg/dL)    Creatinine, Ser 1.32  0.50 - 1.10 (mg/dL)    Calcium 9.0  8.4 - 10.5 (mg/dL)    GFR calc non Af Amer >60  >60 (mL/min)    GFR calc Af Amer >60  >60 (mL/min)   CBC     Status: Abnormal   Collection Time   02/15/11  9:19 AM      Component Value Range Comment   WBC 4.3  4.0 - 10.5 (K/uL)    RBC 3.83 (*) 3.87 - 5.11 (MIL/uL)    Hemoglobin 7.6 (*) 12.0 - 15.0 (g/dL)    HCT 44.0 (*) 10.2 - 46.0 (%)    MCV 66.3 (*) 78.0 - 100.0 (fL)    MCH 19.8 (*) 26.0 - 34.0 (pg)    MCHC 29.9 (*) 30.0 - 36.0 (g/dL)    RDW 72.5 (*) 36.6 - 15.5 (%)    Platelets 287  150 - 400 (K/uL) PLATELET COUNT CONFIRMED BY SMEAR  CBC     Status: Abnormal   Collection Time   02/16/11  4:48 AM      Component Value Range Comment   WBC 7.0  4.0 - 10.5 (K/uL)    RBC 4.52  3.87 - 5.11 (MIL/uL)    Hemoglobin 9.7 (*) 12.0 - 15.0 (g/dL)    HCT 44.0 (*) 34.7 - 46.0 (%)    MCV 68.8 (*) 78.0 - 100.0 (fL)    MCH 21.5 (*) 26.0 - 34.0 (pg)    MCHC 31.2  30.0 - 36.0 (g/dL)    RDW 42.5 (*) 95.6 - 15.5 (%)    Platelets 293  150 - 400 (K/uL)   COMPREHENSIVE METABOLIC PANEL     Status: Normal    Collection Time   02/16/11  4:48 AM      Component Value Range Comment   Sodium 139  135 - 145 (mEq/L)    Potassium 3.7  3.5 - 5.1 (mEq/L)    Chloride 106  96 - 112 (mEq/L)    CO2 25  19 - 32 (mEq/L)    Glucose, Bld 82  70 - 99 (mg/dL)    BUN 8  6 - 23 (mg/dL)    Creatinine, Ser 5.62  0.50 - 1.10 (mg/dL)    Calcium 9.0  8.4 - 10.5 (mg/dL)    Total Protein 6.8  6.0 - 8.3 (g/dL)    Albumin 3.5  3.5 - 5.2 (g/dL)    AST 9  0 - 37 (U/L)    ALT 7  0 - 35 (U/L)    Alkaline Phosphatase 57  39 - 117 (U/L)    Total Bilirubin 0.5  0.3 - 1.2 (mg/dL)    GFR calc non Af Amer >60  >60 (mL/min)    GFR calc Af Amer >60  >60 (mL/min)    Recent Results (from the past 240 hour(s))  MRSA PCR SCREENING     Status: Normal   Collection Time   02/15/11 12:35 AM      Component Value Range Status Comment   MRSA by PCR NEGATIVE  NEGATIVE  Final      Hospital Course: This very pleasant 45 year old lady was admitted to the hospital because she felt somewhat tired and fatigued. She was found to have a hemoglobin of 4.7 on admission. She does give a history of rectal bleeding for the last 2-3 weeks prior to admission. She was diagnosed previously to have internal and external hemorrhoids by her gastroenterologist Dr. Loreta Ave in  [place]. She has not gone for followup due to lack of insurance and financial concerns. Throughout the hospitalization she remained hemodynamically stable and there was no evidence of active rectal bleeding. In fact fecal occult blood was negative. In total she received 5 units of blood transfusion and her hemoglobin after these units of blood transfusion was acceptable at 9.7. Today she feels well and is keen to go home. She clearly will need gastroenterology followup and she wants to have this done in [place]. She will need iron supplementation and I will give her prescription for this.  Discharge Exam: Blood pressure 118/68, pulse 60, temperature 98.2 F (36.8 C), temperature source  Oral, resp. rate 16, height 5\' 10"  (1.778 m), weight 113.5 kg (250 lb 3.6 oz), SpO2 100.00%. She looks systemically well. Heart sounds are present and normal. Lung fields are clear. Abdomen is soft nontender. She is alert and orientated without any focal neurological signs.  Disposition: Home.  Discharge Orders    Future Orders Please Complete By Expires   Diet - low sodium heart healthy      Increase activity slowly      Discharge instructions      Comments:   If you have a large amount of rectal  Bleeding,please come to the ER      Follow-up Information    Follow up with Jonette Eva, MD. Call in 1 day.   Contact information:   [address] [place] [address] [phone]          Signed: Wilson Singer 02/16/2011, 9:18 AM

## 2011-02-16 NOTE — Progress Notes (Signed)
Pt given discharge instructions.  Pt wanting to eat lunch before going home.  Fixed hemorrhoid cream with lavender, tea tree and geranium essential oil in aloe vera gel for patient.  Pt verbalized understanding of discharge instructions.  Scripts given to patient by MD.  Pt to make appointment with Dr. Darrick Penna in am for follow up.  No acute distress noted. Iv's discontinued.  Pt ready for discharge this afternoon.

## 2011-02-17 ENCOUNTER — Encounter: Payer: Self-pay | Admitting: Internal Medicine

## 2011-03-03 ENCOUNTER — Ambulatory Visit: Payer: Self-pay | Admitting: Gastroenterology

## 2011-03-07 ENCOUNTER — Encounter: Payer: Self-pay | Admitting: Urgent Care

## 2011-03-07 ENCOUNTER — Ambulatory Visit: Payer: Self-pay | Admitting: Urgent Care

## 2011-03-07 ENCOUNTER — Ambulatory Visit (INDEPENDENT_AMBULATORY_CARE_PROVIDER_SITE_OTHER): Payer: Self-pay | Admitting: Urgent Care

## 2011-03-07 DIAGNOSIS — K5900 Constipation, unspecified: Secondary | ICD-10-CM | POA: Insufficient documentation

## 2011-03-07 DIAGNOSIS — D649 Anemia, unspecified: Secondary | ICD-10-CM

## 2011-03-07 DIAGNOSIS — K921 Melena: Secondary | ICD-10-CM

## 2011-03-07 NOTE — Assessment & Plan Note (Addendum)
Traci [name] is a 45 y.o. female w/ profound IDA requiring 5 unit transfusion. She had GI symptoms including long-standing large volume hematochezia and occasional heartburn. She is going to require further evaluation to evaluate for colorectal carcinoma, peptic ulcer disease, NSAID-induced GI tract injury, AVM, or diverticular bleeding with colonoscopy +/- EGD.  I have discussed risks & benefits which include, but are not limited to, bleeding, infection, perforation & drug reaction.  The patient agrees with this plan & written consent will be obtained.

## 2011-03-07 NOTE — Assessment & Plan Note (Addendum)
Chronic constipation is worse with oral iron.  Hold your iron for 7 days prior to your procedures Do not take Goodys, BC powders, Advil, ibuprofen or aspirin products for pain You can take Tylenol as needed for pain as directed on the bottle Prilosec 20 mg daily to protect your stomach Please call Dr. Anthony Sar office to make an appointment to establish primary care physician You can try MiraLax 17 g daily as needed for constipation

## 2011-03-07 NOTE — Assessment & Plan Note (Signed)
See anemia ?

## 2011-03-07 NOTE — Progress Notes (Signed)
Primary Care Physician:  n/a Primary Gastroenterologist:  Dr. [name]  Chief Complaint  Patient presents with  . Rectal Bleeding    hurts having a bowel movement  . Constipation    water only  . Bloated    HPI:  Traci [name] is a 45 y.o. female here as a new patient for profound iron deficiency anemia.  Pt noticed increased fatigue & SOBOE while at  work.  Has been passing bright red blood per rectum like water in large amounts over 2 yrs.  C/o sharp proctalgia like a knife.  Went to ER at [facility] & was admitted with hemoglobin of 4.7.  She was transfused 5 units of packed RBCs.  Hx hemorrhoids.  No period in 5 yrs.  Denies vaginal bleeding or epistaxis.  Last colonosocpy Dr Loreta Ave 2008.  Hgb 9.7 at hospital discharge.  Iron was less than 10.  UIBC 434.  Ferritin 1.  INR 0.95.  On iron daily.  Now constipated w/ iron, on stool softeners.  BM once per week.  Tried womens one a day lax-no relief.  Drinking prune juice.  Denies abd pain.  Denies N/V.  Heartburn w/ certain foods w/ spicy foods couple times per month.  C/o anorexia.  Denies dysphagia or odynophagia.  Was taking ibuprofen, BC powders, Aleve prn several time per week. She has since stopped.  Past Medical History  Diagnosis Date  . Asthma   . S/P colonoscopy 04/2007    Dr. Mann-hemorrhoids (int/ext)  . Hemorrhoids    Past Surgical History  Procedure Date  . Right thumb     Current Outpatient Prescriptions  Medication Sig Dispense Refill  . Aspirin-Salicylamide-Caffeine (BC HEADACHE PO) Take 1 Package by mouth daily.        . ferrous sulfate 325 (65 FE) MG EC tablet Take 1 tablet (325 mg total) by mouth 2 (two) times daily with a meal.  90 tablet  0  . hydrocortisone (PREPARATION H HYDROCORTISONE) 1 % cream Apply 1 application topically 2 (two) times daily as needed. Pain       . ibuprofen (ADVIL,MOTRIN) 200 MG tablet Take 200 mg by mouth every 6 (six) hours as needed.        . naproxen sodium (ANAPROX) 220 MG tablet Take 220 mg  by mouth daily.        . shark liver oil-cocoa butter (HEMORRHOIDAL) 0.25-3-85.5 % suppository Place 1 suppository rectally as needed. Hemorrhoidal Pain       . diphenhydramine-acetaminophen (TYLENOL PM) 25-500 MG TABS Take 1 tablet by mouth at bedtime as needed. Sleep/Pain         Allergies as of 03/07/2011 - Review Complete 03/07/2011  Allergen Reaction Noted  . Penicillins Nausea And Vomiting 02/14/2011   Family HIstory:  There is no known family history of colorectal carcinoma , liver disease, or inflammatory bowel disease.  History   Social History  . Marital Status: Single    Spouse Name: N/A    Number of Children: N/A  . Years of Education: N/A   Occupational History  . Not on file.   Social History Main Topics  . Smoking status: Never Smoker   . Smokeless tobacco: Not on file  . Alcohol Use: No  . Drug Use: No  . Sexually Active: Yes    Birth Control/ Protection: None  Review of Systems: Gen: Denies any fever, chills, sweats, anorexia, fatigue, weakness, malaise, weight loss, and sleep disorder CV: Denies chest pain, angina, palpitations, syncope, orthopnea, PND, peripheral  edema, and claudication. Resp: Denies dyspnea at rest, dyspnea with exercise, cough, sputum, wheezing, coughing up blood, and pleurisy. GI: Denies vomiting blood, jaundice, and fecal incontinence.   Denies dysphagia or odynophagia. GU : Denies urinary burning, blood in urine, urinary frequency, urinary hesitancy, nocturnal urination, and urinary incontinence. MS: Denies joint pain, limitation of movement, and swelling, stiffness, low back pain, extremity pain. Denies muscle weakness, cramps, atrophy.  Derm: Denies rash, itching, dry skin, hives, moles, warts, or unhealing ulcers.  Psych: Denies depression, anxiety, memory loss, suicidal ideation, hallucinations, paranoia, and confusion. Heme: Denies bruising and enlarged lymph nodes.  Physical Exam: BP 118/80  Pulse 77  Temp(Src) 97.8 F (36.6  C) (Temporal)  Ht 5\' 7"  (1.702 m)  Wt 235 lb 6.4 oz (106.777 kg)  BMI 36.87 kg/m2 General:   Alert,  Well-developed, obese, pleasant and cooperative in NAD Head:  Normocephalic and atraumatic. Eyes:  Sclera clear, no icterus.   Conjunctiva pink. Ears:  Normal auditory acuity. Nose:  No deformity, discharge,  or lesions. Mouth:  No deformity or lesions, OP pink/moist. Neck:  Supple; no masses or thyromegaly. Lungs:  Clear throughout to auscultation.   No wheezes, crackles, or rhonchi. No acute distress. Heart:  Regular rate and rhythm; no murmurs, clicks, rubs,  or gallops. Abdomen:  Soft, obese, nontender and nondistended. Palpable fullness and firm nodules to right upper quadrant and epigastrium without discrete mass.  Nontender. No hepatosplenomegaly or hernias noted. Exam is limited given patient's body habitus. Normal bowel sounds, without guarding, and without rebound.   Rectal:  Deferred until time of colonoscopy.   Msk:  Symmetrical without gross deformities. Normal posture. Pulses:  Normal pulses noted. Extremities:  Without clubbing or edema. Neurologic:  Alert and  oriented x4;  grossly normal neurologically. Skin:  Intact without significant lesions or rashes. Cervical Nodes:  No significant cervical adenopathy. Psych:  Alert and cooperative. Normal mood and affect.

## 2011-03-07 NOTE — Patient Instructions (Signed)
Hold your iron for 7 days prior to your procedures Do not take Goodys, BC powders, Advil, ibuprofen or aspirin products for pain You can take Tylenol as needed for pain as directed on the bottle Prilosec 20 mg daily to protect your stomach Please call Dr. Anthony Sar office to make an appointment to establish primary care physician You can try MiraLax 17 g daily as needed for constipation

## 2011-03-10 NOTE — Progress Notes (Signed)
No PCP on file 

## 2011-03-11 ENCOUNTER — Other Ambulatory Visit: Payer: Self-pay | Admitting: Gastroenterology

## 2011-03-11 DIAGNOSIS — D649 Anemia, unspecified: Secondary | ICD-10-CM

## 2011-03-27 DIAGNOSIS — K644 Residual hemorrhoidal skin tags: Secondary | ICD-10-CM

## 2011-03-27 HISTORY — PX: COLONOSCOPY: SHX174

## 2011-03-27 HISTORY — DX: Residual hemorrhoidal skin tags: K64.4

## 2011-04-09 ENCOUNTER — Other Ambulatory Visit: Payer: Self-pay | Admitting: Gastroenterology

## 2011-04-09 ENCOUNTER — Telehealth: Payer: Self-pay | Admitting: Gastroenterology

## 2011-04-09 DIAGNOSIS — K51411 Inflammatory polyps of colon with rectal bleeding: Secondary | ICD-10-CM

## 2011-04-09 MED ORDER — PEG 3350-KCL-NA BICARB-NACL 420 G PO SOLR: ORAL | Status: AC

## 2011-04-09 NOTE — Telephone Encounter (Signed)
Pt called, could not afford [employer]- changed to [facility]- faxed scrip and new instructions to [employer]

## 2011-04-10 ENCOUNTER — Encounter (HOSPITAL_COMMUNITY): Payer: Self-pay | Admitting: Pharmacy Technician

## 2011-04-10 ENCOUNTER — Telehealth: Payer: Self-pay

## 2011-04-10 MED ORDER — SODIUM CHLORIDE 0.45 % IV SOLN
Freq: Once | INTRAVENOUS | Status: AC
  Administered 2011-04-11: 10:00:00 via INTRAVENOUS

## 2011-04-10 NOTE — Telephone Encounter (Signed)
Called pt to update any new info prior to procedure on 04/11/2011. Pt says she has no new problems and no change in medications.

## 2011-04-11 ENCOUNTER — Ambulatory Visit (HOSPITAL_COMMUNITY)
Admission: RE | Admit: 2011-04-11 | Discharge: 2011-04-11 | Disposition: A | Discharge status: Home / Self Care | Payer: Self-pay | Source: Ambulatory Visit | Attending: Internal Medicine | Admitting: Internal Medicine

## 2011-04-11 ENCOUNTER — Encounter (HOSPITAL_COMMUNITY)
Admission: RE | Disposition: A | Discharge status: Home / Self Care | Payer: Self-pay | Source: Ambulatory Visit | Attending: Internal Medicine

## 2011-04-11 ENCOUNTER — Other Ambulatory Visit: Payer: Self-pay | Admitting: Internal Medicine

## 2011-04-11 ENCOUNTER — Encounter (HOSPITAL_COMMUNITY): Payer: Self-pay | Admitting: *Deleted

## 2011-04-11 DIAGNOSIS — K921 Melena: Secondary | ICD-10-CM | POA: Insufficient documentation

## 2011-04-11 DIAGNOSIS — D649 Anemia, unspecified: Secondary | ICD-10-CM

## 2011-04-11 DIAGNOSIS — K644 Residual hemorrhoidal skin tags: Secondary | ICD-10-CM

## 2011-04-11 DIAGNOSIS — K2990 Gastroduodenitis, unspecified, without bleeding: Secondary | ICD-10-CM

## 2011-04-11 DIAGNOSIS — K2970 Gastritis, unspecified, without bleeding: Secondary | ICD-10-CM

## 2011-04-11 DIAGNOSIS — K2940 Chronic atrophic gastritis without bleeding: Secondary | ICD-10-CM | POA: Insufficient documentation

## 2011-04-11 DIAGNOSIS — D509 Iron deficiency anemia, unspecified: Secondary | ICD-10-CM | POA: Insufficient documentation

## 2011-04-11 DIAGNOSIS — A048 Other specified bacterial intestinal infections: Secondary | ICD-10-CM | POA: Insufficient documentation

## 2011-04-11 DIAGNOSIS — K648 Other hemorrhoids: Secondary | ICD-10-CM | POA: Insufficient documentation

## 2011-04-11 SURGERY — COLONOSCOPY WITH ESOPHAGOGASTRODUODENOSCOPY (EGD)
Anesthesia: Moderate Sedation

## 2011-04-11 MED ORDER — MEPERIDINE HCL 100 MG/ML IJ SOLN
INTRAMUSCULAR | Status: DC | PRN
  Administered 2011-04-11: 25 mg via INTRAVENOUS
  Administered 2011-04-11: 50 mg via INTRAVENOUS

## 2011-04-11 MED ORDER — STERILE WATER FOR IRRIGATION IR SOLN
Status: DC | PRN
  Administered 2011-04-11: 10:00:00

## 2011-04-11 MED ORDER — HYDROCORTISONE 2.5 % RE CREA
TOPICAL_CREAM | Freq: Two times a day (BID) | RECTAL | Status: DC
  Filled 2011-04-11: qty 28.35

## 2011-04-11 MED ORDER — MIDAZOLAM HCL 5 MG/5ML IJ SOLN
INTRAMUSCULAR | Status: DC | PRN
  Administered 2011-04-11: 2 mg via INTRAVENOUS
  Administered 2011-04-11 (×3): 1 mg via INTRAVENOUS

## 2011-04-11 MED ORDER — MIDAZOLAM HCL 5 MG/5ML IJ SOLN
INTRAMUSCULAR | Status: AC
  Filled 2011-04-11: qty 10

## 2011-04-11 MED ORDER — MEPERIDINE HCL 100 MG/ML IJ SOLN
INTRAMUSCULAR | Status: AC
  Filled 2011-04-11: qty 2

## 2011-04-11 MED ORDER — BUTAMBEN-TETRACAINE-BENZOCAINE 2-2-14 % EX AERO
INHALATION_SPRAY | CUTANEOUS | Status: DC | PRN
  Administered 2011-04-11: 2 via TOPICAL

## 2011-04-11 NOTE — H&P (Signed)
  I have seen & examined the patient prior to the procedure(s) today and reviewed the history and physical/consultation.  There have been no changes.  After consideration of the risks, benefits, alternatives and imponderables, the patient has consented to the procedure(s).   

## 2011-04-16 NOTE — Telephone Encounter (Signed)
ok 

## 2011-04-28 ENCOUNTER — Encounter: Payer: Self-pay | Admitting: Internal Medicine

## 2011-04-28 NOTE — Progress Notes (Signed)
Patient ID: Traci [name], female   DOB: [birth year], 45 y.o.   MRN: [id number] Patient has HP gastritis. Need to prescribe Prevacid 30 mg orally twice a day x14 days, Biaxin 500 mg orally twice a day x14 days and Flagyl 500 mg orally twice a day x14 days. Please let patient know. Please send a copy of the pathology report to referring/primary care physician. Call for followup appointment with [place] in 2-3 months from now.

## 2011-04-29 ENCOUNTER — Other Ambulatory Visit (HOSPITAL_COMMUNITY): Payer: Self-pay | Admitting: Family Medicine

## 2011-04-29 DIAGNOSIS — Z139 Encounter for screening, unspecified: Secondary | ICD-10-CM

## 2011-05-05 ENCOUNTER — Ambulatory Visit (HOSPITAL_COMMUNITY)
Admission: RE | Admit: 2011-05-05 | Discharge: 2011-05-05 | Disposition: A | Discharge status: Home / Self Care | Payer: PRIVATE HEALTH INSURANCE | Source: Ambulatory Visit | Attending: Family Medicine | Admitting: Family Medicine

## 2011-05-05 DIAGNOSIS — Z1231 Encounter for screening mammogram for malignant neoplasm of breast: Secondary | ICD-10-CM | POA: Insufficient documentation

## 2011-05-05 DIAGNOSIS — Z139 Encounter for screening, unspecified: Secondary | ICD-10-CM

## 2012-02-17 ENCOUNTER — Encounter (HOSPITAL_COMMUNITY): Payer: Self-pay

## 2012-02-17 ENCOUNTER — Inpatient Hospital Stay (HOSPITAL_COMMUNITY)
Admission: EM | Admit: 2012-02-17 | Discharge: 2012-02-21 | DRG: 349 | Disposition: A | Discharge status: Home / Self Care | Payer: Self-pay | Attending: Internal Medicine | Admitting: Internal Medicine

## 2012-02-17 DIAGNOSIS — R5383 Other fatigue: Secondary | ICD-10-CM | POA: Diagnosis present

## 2012-02-17 DIAGNOSIS — B9681 Helicobacter pylori [H. pylori] as the cause of diseases classified elsewhere: Secondary | ICD-10-CM | POA: Diagnosis present

## 2012-02-17 DIAGNOSIS — B9689 Other specified bacterial agents as the cause of diseases classified elsewhere: Secondary | ICD-10-CM | POA: Diagnosis present

## 2012-02-17 DIAGNOSIS — R5381 Other malaise: Secondary | ICD-10-CM | POA: Diagnosis present

## 2012-02-17 DIAGNOSIS — K2940 Chronic atrophic gastritis without bleeding: Secondary | ICD-10-CM | POA: Diagnosis present

## 2012-02-17 DIAGNOSIS — Z6834 Body mass index (BMI) 34.0-34.9, adult: Secondary | ICD-10-CM

## 2012-02-17 DIAGNOSIS — D649 Anemia, unspecified: Secondary | ICD-10-CM

## 2012-02-17 DIAGNOSIS — Z880 Allergy status to penicillin: Secondary | ICD-10-CM

## 2012-02-17 DIAGNOSIS — A048 Other specified bacterial intestinal infections: Secondary | ICD-10-CM

## 2012-02-17 DIAGNOSIS — D500 Iron deficiency anemia secondary to blood loss (chronic): Secondary | ICD-10-CM | POA: Diagnosis present

## 2012-02-17 DIAGNOSIS — K644 Residual hemorrhoidal skin tags: Secondary | ICD-10-CM | POA: Diagnosis present

## 2012-02-17 DIAGNOSIS — K648 Other hemorrhoids: Principal | ICD-10-CM | POA: Diagnosis present

## 2012-02-17 DIAGNOSIS — E669 Obesity, unspecified: Secondary | ICD-10-CM | POA: Diagnosis present

## 2012-02-17 DIAGNOSIS — K921 Melena: Secondary | ICD-10-CM | POA: Diagnosis present

## 2012-02-17 HISTORY — DX: Unspecified chronic gastritis without bleeding: K29.50

## 2012-02-17 HISTORY — DX: Helicobacter pylori (H. pylori) as the cause of diseases classified elsewhere: B96.81

## 2012-02-17 HISTORY — DX: Melena: K92.1

## 2012-02-17 HISTORY — DX: Iron deficiency anemia secondary to blood loss (chronic): D50.0

## 2012-02-17 HISTORY — DX: Residual hemorrhoidal skin tags: K64.4

## 2012-02-17 LAB — BASIC METABOLIC PANEL
CO2: 25 mEq/L (ref 19–32)
Calcium: 9.3 mg/dL (ref 8.4–10.5)
Creatinine, Ser: 0.78 mg/dL (ref 0.50–1.10)
GFR calc non Af Amer: >90 mL/min (ref >90)
Glucose, Bld: 90 mg/dL (ref 70–99)

## 2012-02-17 LAB — CBC WITH DIFFERENTIAL/PLATELET
Basophils Absolute: 0.1 10*3/uL (ref 0.0–0.1)
Basophils Relative: 1 % (ref 0–1)
Eosinophils Absolute: 0.1 10*3/uL (ref 0.0–0.7)
Hemoglobin: 4.4 g/dL — CL (ref 12.0–15.0)
MCH: 14.1 pg — ABNORMAL LOW (ref 26.0–34.0)
MCHC: 26 g/dL — ABNORMAL LOW (ref 30.0–36.0)
Monocytes Absolute: 0.3 10*3/uL (ref 0.1–1.0)
Neutrophils Relative %: 68 % (ref 43–77)
RDW: 20.8 % — ABNORMAL HIGH (ref 11.5–15.5)

## 2012-02-17 LAB — URINALYSIS, ROUTINE W REFLEX MICROSCOPIC
Glucose, UA: NEGATIVE mg/dL
Hgb urine dipstick: NEGATIVE
Ketones, ur: NEGATIVE mg/dL
Protein, ur: NEGATIVE mg/dL

## 2012-02-17 MED ORDER — ONDANSETRON HCL 4 MG/2ML IJ SOLN: 4.0000 mg | Freq: Four times a day (QID) | INTRAMUSCULAR | Status: DC | PRN

## 2012-02-17 MED ORDER — FOLIC ACID 1 MG PO TABS
1.0000 mg | ORAL_TABLET | Freq: Every day | ORAL | Status: DC
  Administered 2012-02-18 – 2012-02-21 (×5): 1 mg via ORAL
  Filled 2012-02-17 (×6): qty 1

## 2012-02-17 MED ORDER — SODIUM CHLORIDE 0.9 % IJ SOLN
3.0000 mL | Freq: Two times a day (BID) | INTRAMUSCULAR | Status: DC
  Administered 2012-02-18 – 2012-02-21 (×6): 3 mL via INTRAVENOUS
  Filled 2012-02-17 (×6): qty 3

## 2012-02-17 MED ORDER — ACETAMINOPHEN 325 MG PO TABS: 650.0000 mg | ORAL_TABLET | ORAL | Status: DC | PRN

## 2012-02-17 MED ORDER — ONDANSETRON HCL 4 MG PO TABS: 4.0000 mg | ORAL_TABLET | Freq: Four times a day (QID) | ORAL | Status: DC | PRN

## 2012-02-17 MED ORDER — TRAZODONE HCL 50 MG PO TABS: 25.0000 mg | ORAL_TABLET | Freq: Every evening | ORAL | Status: DC | PRN

## 2012-02-17 MED ORDER — FUROSEMIDE 10 MG/ML IJ SOLN
20.0000 mg | Freq: Once | INTRAMUSCULAR | Status: AC
  Administered 2012-02-18: 20 mg via INTRAVENOUS
  Filled 2012-02-17: qty 2

## 2012-02-17 MED ORDER — FERUMOXYTOL INJECTION 510 MG/17 ML
510.0000 mg | Freq: Once | INTRAVENOUS | Status: AC
  Administered 2012-02-18: 510 mg via INTRAVENOUS
  Filled 2012-02-17: qty 17

## 2012-02-17 NOTE — ED Provider Notes (Signed)
History   Scribed for Traci Hutching, MD, the patient was seen in room APA17/APA17 . This chart was scribed by Lewanda Rife.   CSN: [id number]  Arrival date & time 02/17/12  1813   First MD Initiated Contact with Patient 02/17/12 1823      Chief Complaint  Patient presents with  . Fatigue    (Consider location/radiation/quality/duration/timing/severity/associated sxs/prior treatment) HPI [name] is a 46 y.o. female who presents to the Emergency Department complaining of constant moderate fatigue and dizziness for the past 2 months.  Pt reports associated shortness of breath. Pt reports shortness of breath improves at rest. Pt reports she has an appointment October 1. Pt has a hx of a previous admission in November of 2012 with a primary dx of Anemia. Pt additionally has a hx of a colonoscopy with EGD, and inflammatory polyps of colon 04/11/11.     Past Medical History  Diagnosis Date  . S/P colonoscopy 04/2007    Dr. Mann-hemorrhoids (int/ext)  . Hemorrhoids   . Asthma     as a child  . Anemia     Past Surgical History  Procedure Date  . Right thumb     Family History  Problem Relation Age of Onset  . Cirrhosis Father   . Aneurysm Mother   . Colon cancer Neg Hx     History  Substance Use Topics  . Smoking status: Never Smoker   . Smokeless tobacco: Not on file  . Alcohol Use: No    OB History    Grav Para Term Preterm Abortions TAB SAB Ect Mult Living                  Review of Systems  Constitutional: Positive for fatigue.  HENT: Negative.   Respiratory: Negative.   Cardiovascular: Negative.   Gastrointestinal: Positive for vomiting.  Musculoskeletal: Negative.   Skin: Negative.   Neurological: Positive for dizziness, weakness and light-headedness.  Hematological: Negative.   Psychiatric/Behavioral: Negative.   All other systems reviewed and are negative.    Allergies  Penicillins  Home Medications   Current Outpatient Rx    Name Route Sig Dispense Refill  . ACETAMINOPHEN 500 MG PO TABS Oral Take 500 mg by mouth every 6 (six) hours as needed. pain     . FERROUS SULFATE 325 (65 FE) MG PO TBEC Oral Take 1 tablet (325 mg total) by mouth 2 (two) times daily with a meal. 90 tablet 0  . PE-SHARK LIVER OIL-COCOA BUTTR 0.25-3-85.5 % RE SUPP Rectal Place 1 suppository rectally as needed. Hemorrhoidal Pain       BP 125/68  Pulse 84  Temp 98.4 F (36.9 C) (Oral)  Resp 16  Ht 5\' 10"  (1.778 m)  Wt 245 lb (111.131 kg)  BMI 35.15 kg/m2  SpO2 100%  Physical Exam  Nursing note and vitals reviewed. Constitutional: She is oriented to person, place, and time. She appears well-developed and well-nourished.  HENT:  Head: Normocephalic and atraumatic.  Eyes: Conjunctivae normal and EOM are normal. Pupils are equal, round, and reactive to light.  Neck: Normal range of motion. Neck supple.  Cardiovascular: Normal rate, regular rhythm and normal heart sounds.   Pulmonary/Chest: Effort normal and breath sounds normal.  Abdominal: Soft. Bowel sounds are normal.  Musculoskeletal: Normal range of motion.  Neurological: She is alert and oriented to person, place, and time.  Skin: Skin is warm and dry.  Psychiatric: She has a normal mood and affect.    ED  Course  Procedures (including critical care time) 6:40pm Pt informed of basic work and possibility of admission.  Labs Reviewed  CBC WITH DIFFERENTIAL - Abnormal; Notable for the following:    RBC 3.13 (*)     Hemoglobin 4.4 (*)     HCT 16.9 (*)     MCV 54.0 (*)     MCH 14.1 (*)     MCHC 26.0 (*)     RDW 20.8 (*)     All other components within normal limits  BASIC METABOLIC PANEL  URINALYSIS, ROUTINE W REFLEX MICROSCOPIC  TYPE AND SCREEN  PREPARE RBC (CROSSMATCH)  PROTIME-INR  APTT  TSH  FERRITIN   No results found.   No diagnosis found.   Date: 02/17/2012  Rate: 83  Rhythm: normal sinus rhythm  QRS Axis: normal  Intervals: normal  ST/T Wave  abnormalities: normal  Conduction Disutrbances: none  Narrative Interpretation: unremarkable     MDM  Symptomatic anemia. Transfuse 2 units of blood. Admit to hospitalist.      I personally performed the services described in this documentation, which was scribed in my presence. The recorded information has been reviewed and considered.     Traci Hutching, MD 02/17/12 2225

## 2012-02-17 NOTE — ED Notes (Signed)
Patient informed of Hgb result and the need for IV and blood transfusion.  Patient consented to IV placement and blood collected for Type and Screen.

## 2012-02-17 NOTE — ED Notes (Signed)
CRITICAL VALUE ALERT  Critical value received:  Hgb 4.4 Date of notification:  02/17/12  Time of notification:  2006  Critical value read back: yes  Nurse who received alert:  Charlette Caffey, RN  MD notified:  Dr. Adriana Simas  Time:  2008

## 2012-02-17 NOTE — ED Notes (Signed)
Pt reports has had decreased energy for the past couple of months.   Says has been taking Vitamin B 12.  Today pt c/o dizziness  And vomiting.

## 2012-02-17 NOTE — H&P (Signed)
[facility] History and Physical  Stephani Janak  UEA:[id number]  DOB: 25-Dec-1965   DOA: 02/17/2012   PCP:   Provider Not In System   Chief Complaint:  Progressive fatigue for 2 months  HPI: Traci [name] is an 46 y.o. female.  Obese middle-aged [place] American lady who was seen at this facility one year ago for severe iron deficiency anemia, with a hemoglobin of 4.3 and a serum ferritin of 1.0. She was transfused 5 units of blood and at discharge her hemoglobin was 9.7. she was investigated with EGD and colonoscopies and the only obvious cause of bleeding was hemorrhoids. Biopsies from her stomach later was returned positive for H. Pylori, but it is not clear that she responded to requests for treatment of H. Pylori.  The patient returns complaining of ongoing hemorrhoidal bleeding, reports that although she is presumably postmenopausal and has not menstruated for the past 4 years, loses more blood per rectum each month than she normally would from menstruation. She takes OTC iron tablets on/off. She has no health insurance and does not get medical followup. She denies any weight loss, she denies melena stool or hematemesis; denies abdominal pain.  She has not been taking aspirin,  ibuprofen, or Goody's powders which she was advised against at her last admission, but she has been taking naproxen episodically.   She was evaluated in the emergency room and found to have a hemoglobin of 4.4 and the hospitalist service called to assist with  Rewiew of Systems:   All systems negative except as marked bold or noted in the HPI;  Constitutional: Negative for malaise, fever and chills. ;  Eyes: Negative for eye pain, redness and discharge. ;  ENMT: Negative for ear pain, hoarseness, nasal congestion, sinus pressure and sore throat. ;  Cardiovascular: Negative for chest pain, palpitations, diaphoresis, and peripheral edema. ;  Respiratory: Negative for cough, hemoptysis, wheezing and  stridor. ;  Gastrointestinal: Negative for nausea, vomiting, diarrhea, constipation, abdominal pain, melena, hematemesis, jaundice and . unusual weight loss..   Genitourinary: Negative for frequency, dysuria, incontinence,flank pain and hematuria; Musculoskeletal: Negative for back pain and neck pain. Negative for swelling and trauma.;  Skin: . Negative for pruritus, rash, abrasions, bruising and skin lesion.; ulcerations Neuro: Negative for headache, lightheadedness and neck stiffness. Negative for weakness, altered level of consciousness , altered mental status, extremity weakness, burning feet, involuntary movement, seizure and syncope.  Psych: negative for anxiety, depression, insomnia, tearfulness, panic attacks, hallucinations, paranoia, suicidal or homicidal ideation    Past Medical History  Diagnosis Date  . S/P colonoscopy 04/2007    Dr. Mann-hemorrhoids (int/ext)  . Hemorrhoids   . Asthma     as a child  . Anemia     Past Surgical History  Procedure Date  . Right thumb     Medications:  HOME MEDS: Prior to Admission medications   Medication Sig Start Date End Date Taking? Authorizing Provider  Cyanocobalamin (B-12 PO) Take 1 tablet by mouth daily.   Yes Historical Provider, MD  naproxen sodium (ALEVE) 220 MG tablet Take 220-440 mg by mouth daily as needed. For pain   Yes Historical Provider, MD  shark liver oil-cocoa butter (HEMORRHOIDAL) 0.25-3-85.5 % suppository Place 1 suppository rectally as needed. Hemorrhoidal Pain    Yes Historical Provider, MD  acetaminophen (ACETAMINOPHEN EXTRA STRENGTH) 500 MG tablet Take 500 mg by mouth every 6 (six) hours as needed. pain     Historical Provider, MD     Allergies:  Allergies  Allergen Reactions  . Penicillins Nausea And Vomiting    Social History:   reports that she has never smoked. She does not have any smokeless tobacco history on file. She reports that she does not drink alcohol or use illicit drugs.  Family  History: Family History  Problem Relation Age of Onset  . Cirrhosis Father   . Aneurysm Mother   . Colon cancer Neg Hx      Physical Exam: Filed Vitals:   02/17/12 1814 02/17/12 1835 02/17/12 2146 02/17/12 2211  BP: 155/59 125/68 121/61 124/67  Pulse: 102 84 75 77  Temp: 98.9 F (37.2 C) 98.4 F (36.9 C) 98.5 F (36.9 C) 98.8 F (37.1 C)  TempSrc: Oral Oral Oral Oral  Resp: 20 16 20 18   Height: 5\' 10"  (1.778 m)     Weight: 111.131 kg (245 lb)     SpO2: 98% 100% 100%    Blood pressure 124/67, pulse 77, temperature 98.8 F (37.1 C), temperature source Oral, resp. rate 18, height 5\' 10"  (1.778 m), weight 111.131 kg (245 lb), SpO2 100.00%.  GEN:  Pleasant middle-aged African American lady lying in the stretcher in no acute distress; cooperative with exam PSYCH:  alert and oriented x4; does not appear anxious or depressed; affect is appropriate. HEENT: Mucous membranes markedly pale and anicteric; PERRLA; EOM intact; no cervical lymphadenopathy nor thyromegaly or carotid bruit; no JVD; Breasts:: Not examined CHEST WALL: No tenderness CHEST: Normal respiration, clear to auscultation bilaterally HEART: Regular rate and rhythm; 2/6 systolic murmurs no rubs or gallops BACK: No kyphosis or scoliosis; no CVA tenderness ABDOMEN: Obese, soft non-tender; no masses, no organomegaly, normal abdominal bowel sounds;no intertriginous candida. Rectal Exam: Not done EXTREMITIES: No bone or joint deformity;  no edema; no ulcerations. Genitalia: not examined PULSES: 2+ and symmetric SKIN: Normal hydration no rash or ulceration CNS: Cranial nerves 2-12 grossly intact no focal lateralizing neurologic deficit   Labs on Admission:  Basic Metabolic Panel:  Lab 02/17/12 4782  NA 136  K 3.9  CL 102  CO2 25  GLUCOSE 90  BUN 12  CREATININE 0.78  CALCIUM 9.3  MG --  PHOS --   Liver Function Tests: No results found for this basename: AST:5,ALT:5,ALKPHOS:5,BILITOT:5,PROT:5,ALBUMIN:5 in the  last 168 hours No results found for this basename: LIPASE:5,AMYLASE:5 in the last 168 hours No results found for this basename: AMMONIA:5 in the last 168 hours CBC:  Lab 02/17/12 1913  WBC 7.6  NEUTROABS 5.1  HGB 4.4*  HCT 16.9*  MCV 54.0*  PLT 338   Cardiac Enzymes: No results found for this basename: CKTOTAL:5,CKMB:5,CKMBINDEX:5,TROPONINI:5 in the last 168 hours BNP: No components found with this basename: POCBNP:5 D-dimer: No components found with this basename: D-DIMER:5 CBG: No results found for this basename: GLUCAP:5 in the last 168 hours  Radiological Exams on Admission: No results found.  Assessment/Plan Present on Admission:  .Iron deficiency anemia due to chronic blood loss .Helicobacter pylori gastritis (chronic gastritis) .Obesity .Hematochezia   PLAN: It's very reasonable to presume that this microcytic anemia is due to recurrent iron deficiency anemia second to ongoing chronic loss from hemorrhoids; possibly also some loss from gastritis related to her end-stage use, and on treated H. pylori chronic gastritis. --- Will check her serum ferritin just for information; will transfuse 3 units of packed red cells with some Lasix between; we'll load her with 510 of Ferriheme. Social work consult for assistance with getting H. pylori treatment. That she needs to have her hemorrhoids attended  to; it is not clear that she would benefit from a repeat of the workup done last year.  Advise patient to take high-dose iron chronically with fruit juice; She'll probably benefit also from taking them with full acid.  Other plans as per orders.  Code Status: FULL CODE  Family Communication:  assessment and plan discussed with patient Disposition Plan: Discharge home in the morning after transfusion and discussion with social worker for continued outpatient management   Zaul Hubers Nocturnist [facility] Pager [phone]   02/17/2012, 10:32 PM

## 2012-02-18 ENCOUNTER — Encounter (HOSPITAL_COMMUNITY): Payer: Self-pay | Admitting: Internal Medicine

## 2012-02-18 DIAGNOSIS — R5383 Other fatigue: Secondary | ICD-10-CM | POA: Diagnosis present

## 2012-02-18 DIAGNOSIS — K921 Melena: Secondary | ICD-10-CM

## 2012-02-18 DIAGNOSIS — K644 Residual hemorrhoidal skin tags: Secondary | ICD-10-CM | POA: Diagnosis present

## 2012-02-18 DIAGNOSIS — D509 Iron deficiency anemia, unspecified: Secondary | ICD-10-CM

## 2012-02-18 DIAGNOSIS — K649 Unspecified hemorrhoids: Secondary | ICD-10-CM

## 2012-02-18 LAB — CBC
HCT: 25 % — ABNORMAL LOW (ref 36.0–46.0)
Hemoglobin: 7.4 g/dL — ABNORMAL LOW (ref 12.0–15.0)
MCH: 18.7 pg — ABNORMAL LOW (ref 26.0–34.0)
MCHC: 29.6 g/dL — ABNORMAL LOW (ref 30.0–36.0)
MCV: 63.3 fL — ABNORMAL LOW (ref 78.0–100.0)
Platelets: 311 10*3/uL (ref 150–400)
RBC: 3.95 MIL/uL (ref 3.87–5.11)
RDW: 30.9 % — ABNORMAL HIGH (ref 11.5–15.5)
WBC: 6.1 10*3/uL (ref 4.0–10.5)

## 2012-02-18 LAB — BASIC METABOLIC PANEL
BUN: 14 mg/dL (ref 6–23)
CO2: 26 mEq/L (ref 19–32)
Calcium: 9.6 mg/dL (ref 8.4–10.5)
Glucose, Bld: 110 mg/dL — ABNORMAL HIGH (ref 70–99)
Sodium: 138 mEq/L (ref 135–145)

## 2012-02-18 MED ORDER — SENNOSIDES-DOCUSATE SODIUM 8.6-50 MG PO TABS
1.0000 | ORAL_TABLET | Freq: Two times a day (BID) | ORAL | Status: DC
  Administered 2012-02-18 – 2012-02-21 (×6): 1 via ORAL
  Filled 2012-02-18 (×7): qty 1

## 2012-02-18 MED ORDER — SODIUM CHLORIDE 0.9 % IJ SOLN
INTRAMUSCULAR | Status: AC
  Administered 2012-02-18: 08:00:00
  Filled 2012-02-18: qty 3

## 2012-02-18 MED ORDER — SODIUM CHLORIDE 0.9 % IJ SOLN
INTRAMUSCULAR | Status: AC
  Filled 2012-02-18: qty 3

## 2012-02-18 MED ORDER — METRONIDAZOLE 500 MG PO TABS
500.0000 mg | ORAL_TABLET | Freq: Two times a day (BID) | ORAL | Status: DC
  Administered 2012-02-18 – 2012-02-21 (×6): 500 mg via ORAL
  Filled 2012-02-18 (×6): qty 1

## 2012-02-18 MED ORDER — PANTOPRAZOLE SODIUM 40 MG PO TBEC
40.0000 mg | DELAYED_RELEASE_TABLET | Freq: Two times a day (BID) | ORAL | Status: DC
  Administered 2012-02-18 – 2012-02-21 (×5): 40 mg via ORAL
  Filled 2012-02-18 (×7): qty 1

## 2012-02-18 MED ORDER — CLARITHROMYCIN 500 MG PO TABS
500.0000 mg | ORAL_TABLET | Freq: Two times a day (BID) | ORAL | Status: DC
  Administered 2012-02-18 – 2012-02-21 (×6): 500 mg via ORAL
  Filled 2012-02-18 (×7): qty 1

## 2012-02-18 MED ORDER — HYDROCORTISONE ACETATE 25 MG RE SUPP
25.0000 mg | Freq: Every day | RECTAL | Status: DC
  Administered 2012-02-18 – 2012-02-19 (×2): 25 mg via RECTAL
  Filled 2012-02-18 (×2): qty 1

## 2012-02-18 NOTE — Progress Notes (Signed)
UR Chart Review Completed  

## 2012-02-18 NOTE — Consult Note (Signed)
Please see dictated consult note for details. Patient is a pleasant 46 year old African female who presents with profound anemia. She has well-documented iron deficiency anemia. She has chronic hematochezia described to be moderate to large. She's been passing blood per for the last 5 years. Her first colonoscopy was in 2009 by Dr. Judye Bos of [place]. Most recently she was evaluated by Dr. [name] in November 2012 when she had EGD and colonoscopy. She was documented to have inflamed external hemorrhoids. Surgical intervention was recommended but this did not materialize because patient does not have any insurance. She feels much better after having received 3 units of PRBCs. Patient takes Aleve once in a while. She was on iron previously but it stopped but started again 6 weeks ago when she noted weakness and fatigue. Assessment; Based on prior workup it would be reasonable to proceed in that her iron deficiency anemia is due to chronic hemorrhoidal bleeding. Therefore needs to have surgical intervention are to considering given capsule study or repeat EGD. Recommendations; Patient advised not to take Aleve. She should take Tylenol or OTC Advil when necessary. If she needs an NSAID Advil would be preferred because it has short duration of action and not as potent as Aleve is. Patient advised to stay on iron pills twice a day until she's been advised by her physician to stop iron. Surgical consultation or treatment of chronically bleeding hemorrhoids.

## 2012-02-18 NOTE — Clinical Social Work Note (Signed)
CSW received consult for assistance with medication. CM aware and to follow. CSW will sign off but can be reconsulted if needed.  Derenda Fennel, [place] [phone]

## 2012-02-18 NOTE — Care Management Note (Unsigned)
    Page 1 of 1   02/19/2012     2:54:12 PM   CARE MANAGEMENT NOTE 02/19/2012  Patient:  Traci [name], Traci [name]   Account Number:  [id number]  Date Initiated:  02/18/2012  Documentation initiated by:  Rosemary Holms  Subjective/Objective Assessment:   Pt admitted with anemia/H pyloris gastritis. Spoke to pt at bedside. States that she has been unable to get the meds she needs due to cost. She [facility] on [facility]/12. CM also told her that she may qualify at the     Action/Plan:   free clinic if she took her pay stubs to prove she was employed. CM will request medication assistance needed to prevent risk of readmission.   Anticipated DC Date:  02/19/2012   Anticipated DC Plan:  HOME/SELF CARE  In-house referral  Financial Counselor      DC Planning Services  CM consult      Choice offered to / List presented to:             Status of service:  In process, will continue to follow Medicare Important Message given?   (If response is "NO", the following Medicare IM given date fields will be blank) Date Medicare IM given:   Date Additional Medicare IM given:    Discharge Disposition:    Per UR Regulation:    If discussed at Long Length of Stay Meetings, dates discussed:    Comments:  02/19/12 Rosemary Holms RN BSN CM To have Hemmroidectomy tomorrow  02/18/12 1100 Tashi Band Leanord Hawking RN BSN CM

## 2012-02-18 NOTE — Progress Notes (Signed)
Patient received 2 units of RBC last night and is on her third unit this morning.

## 2012-02-18 NOTE — Progress Notes (Addendum)
Subjective: The patient feels better after the blood transfusions. She feels stronger. She denies hemorrhoidal bleeding or bright red blood rectum overnight. She has not had rectal bleeding in over one week.  Objective: Vital signs in last 24 hours: Filed Vitals:   02/18/12 0628 02/18/12 0704 02/18/12 0728 02/18/12 0815  BP: 118/56  116/60 106/56  Pulse: 65  66 87  Temp: 98.2 F (36.8 C)  98.2 F (36.8 C) 98.9 F (37.2 C)  TempSrc: Oral  Oral Oral  Resp: 20  20 20   Height:      Weight:  102.8 kg (226 lb 10.1 oz)    SpO2:       No intake or output data in the 24 hours ending 02/18/12 0944  Weight change:   Physical exam: General: Pleasant overweight 46 year old African-American woman lying in bed, in no acute distress. Lungs: Clear to auscultation bilaterally. Heart: S1, S2, with a 1-2/6 systolic murmur. Abdomen: Positive bowel sounds, soft, nontender, nondistended. Extremities: No pedal edema.  Lab Results: Basic Metabolic Panel:  Basename 02/17/12 1913  NA 136  K 3.9  CL 102  CO2 25  GLUCOSE 90  BUN 12  CREATININE 0.78  CALCIUM 9.3  MG --  PHOS --   Liver Function Tests: No results found for this basename: AST:2,ALT:2,ALKPHOS:2,BILITOT:2,PROT:2,ALBUMIN:2 in the last 72 hours No results found for this basename: LIPASE:2,AMYLASE:2 in the last 72 hours No results found for this basename: AMMONIA:2 in the last 72 hours CBC:  Basename 02/18/12 0916 02/17/12 1913  WBC 6.1 7.6  NEUTROABS -- 5.1  HGB 7.4* 4.4*  HCT 25.0* 16.9*  MCV 63.3* 54.0*  PLT 311 338   Cardiac Enzymes: No results found for this basename: CKTOTAL:3,CKMB:3,CKMBINDEX:3,TROPONINI:3 in the last 72 hours BNP: No results found for this basename: PROBNP:3 in the last 72 hours D-Dimer: No results found for this basename: DDIMER:2 in the last 72 hours CBG: No results found for this basename: GLUCAP:6 in the last 72 hours Hemoglobin A1C: No results found for this basename: HGBA1C in the last 72  hours Fasting Lipid Panel: No results found for this basename: CHOL,HDL,LDLCALC,TRIG,CHOLHDL,LDLDIRECT in the last 72 hours Thyroid Function Tests: No results found for this basename: TSH,T4TOTAL,FREET4,T3FREE,THYROIDAB in the last 72 hours Anemia Panel: No results found for this basename: VITAMINB12,FOLATE,FERRITIN,TIBC,IRON,RETICCTPCT in the last 72 hours Coagulation:  Basename 02/17/12 2126  LABPROT 14.4  INR 1.14   Urine Drug Screen: Drugs of Abuse  No results found for this basename: labopia,  cocainscrnur,  labbenz,  amphetmu,  thcu,  labbarb    Alcohol Level: No results found for this basename: ETH:2 in the last 72 hours Urinalysis:  Basename 02/17/12 1936  COLORURINE YELLOW  LABSPEC 1.020  PHURINE 6.0  GLUCOSEU NEGATIVE  HGBUR NEGATIVE  BILIRUBINUR NEGATIVE  KETONESUR NEGATIVE  PROTEINUR NEGATIVE  UROBILINOGEN 0.2  NITRITE NEGATIVE  LEUKOCYTESUR NEGATIVE   Misc. Labs:   Micro: No results found for this or any previous visit (from the past 240 hour(s)).  Studies/Results: No results found.  Medications:  Scheduled:    . ferumoxytol  510 mg Intravenous Once  . folic acid  1 mg Oral Daily  . furosemide  20 mg Intravenous Once  . sodium chloride  3 mL Intravenous Q12H  . sodium chloride      . sodium chloride       Continuous:  ZOX:[id number], ondansetron (ZOFRAN) IV, ondansetron, traZODone  Assessment: Active Problems:  Fatigue  Obesity  Hematochezia  Iron deficiency anemia due to chronic blood  loss  Helicobacter pylori gastritis (chronic gastritis)  Hemorrhoids, external   1. Severe iron deficiency anemia secondary to chronic blood loss. Status post 3 units of packed red blood cell transfusions. IV iron was given overnight as well. Followup hemoglobin is pending.  External hemorrhoids with bleeding, likely the source of her severe anemia. She had use a humeral cream periodically over the past year with some improvement. However because of  the cost, she had not used it lately. She has not had bleeding in over one year. She may require elective surgical evaluation, but will consult GI first for their assessment and recommendation. (Of note, she no longer has menstrual periods).  History of H. pylori gastritis. The patient was noncompliant with therapy because of the cost of treatment.  Fatigue secondary to #1.   Plan: 1. We'll check the followup hemoglobin and hematocrit. 2. GI consultation for recommendations regarding management henceforth. 3. Start H. pylori treatment. Case management is already aware that the patient will need assistance with medications upon discharge. 4. Add each bedtime Anusol HC suppositories. Add twice a day dosing of Senokot S. 5. Discharge later today or tomorrow, depending upon GI evaluation.     LOS: 1 day   Kayden Amend 02/18/2012, 9:44 AM

## 2012-02-19 DIAGNOSIS — R5383 Other fatigue: Secondary | ICD-10-CM

## 2012-02-19 LAB — BASIC METABOLIC PANEL
CO2: 25 mEq/L (ref 19–32)
Calcium: 9.6 mg/dL (ref 8.4–10.5)
GFR calc non Af Amer: 79 mL/min — ABNORMAL LOW (ref >90)
Glucose, Bld: 109 mg/dL — ABNORMAL HIGH (ref 70–99)
Potassium: 4 mEq/L (ref 3.5–5.1)
Sodium: 138 mEq/L (ref 135–145)

## 2012-02-19 LAB — CBC
Hemoglobin: 7.6 g/dL — ABNORMAL LOW (ref 12.0–15.0)
MCH: 18.6 pg — ABNORMAL LOW (ref 26.0–34.0)
Platelets: 339 10*3/uL (ref 150–400)
RBC: 4.09 MIL/uL (ref 3.87–5.11)

## 2012-02-19 MED ORDER — DEXTROSE 5 % IV SOLN
2.0000 g | INTRAVENOUS | Status: AC
  Filled 2012-02-19: qty 2

## 2012-02-19 MED ORDER — FERROUS SULFATE 325 (65 FE) MG PO TABS
325.0000 mg | ORAL_TABLET | Freq: Three times a day (TID) | ORAL | Status: DC
  Administered 2012-02-19 – 2012-02-21 (×5): 325 mg via ORAL
  Filled 2012-02-19 (×6): qty 1

## 2012-02-19 NOTE — Progress Notes (Signed)
Subjective: The patient feels better overall. No rectal bleeding overnight. No nausea vomiting. No abdominal pain.  Objective: Vital signs in last 24 hours: Filed Vitals:   02/18/12 1744 02/18/12 2031 02/19/12 0530 02/19/12 1050  BP: 114/75 119/81 107/64 117/74  Pulse: 72 70 64 78  Temp: 97.9 F (36.6 C) 98.4 F (36.9 C) 98.5 F (36.9 C) 98.7 F (37.1 C)  TempSrc: Oral Oral Oral Oral  Resp: 20 19 18 16   Height:      Weight:   111.585 kg (246 lb)   SpO2: 97% 96% 98% 100%    Intake/Output Summary (Last 24 hours) at 02/19/12 1113 Last data filed at 02/19/12 1610  Gross per 24 hour  Intake   1180 ml  Output      0 ml  Net   1180 ml    Weight change: -8.331 kg (-18 lb 5.9 oz)  Physical exam: General: Pleasant overweight 46 year old African-American woman lying in bed, in no acute distress. Lungs: Clear to auscultation bilaterally. Heart: S1, S2, with a 1-2/6 systolic murmur. Abdomen: Positive bowel sounds, soft, nontender, nondistended. Extremities: No pedal edema.  Lab Results: Basic Metabolic Panel:  Basename 02/19/12 0548 02/18/12 0916  NA 138 138  K 4.0 3.9  CL 105 103  CO2 25 26  GLUCOSE 109* 110*  BUN 13 14  CREATININE 0.86 0.77  CALCIUM 9.6 9.6  MG -- --  PHOS -- --   Liver Function Tests: No results found for this basename: AST:2,ALT:2,ALKPHOS:2,BILITOT:2,PROT:2,ALBUMIN:2 in the last 72 hours No results found for this basename: LIPASE:2,AMYLASE:2 in the last 72 hours No results found for this basename: AMMONIA:2 in the last 72 hours CBC:  Basename 02/19/12 0548 02/18/12 0916 02/17/12 1913  WBC 7.4 6.1 --  NEUTROABS -- -- 5.1  HGB 7.6* 7.4* --  HCT 26.2* 25.0* --  MCV 64.1* 63.3* --  PLT 339 311 --   Cardiac Enzymes: No results found for this basename: CKTOTAL:3,CKMB:3,CKMBINDEX:3,TROPONINI:3 in the last 72 hours BNP: No results found for this basename: PROBNP:3 in the last 72 hours D-Dimer: No results found for this basename: DDIMER:2 in the  last 72 hours CBG: No results found for this basename: GLUCAP:6 in the last 72 hours Hemoglobin A1C: No results found for this basename: HGBA1C in the last 72 hours Fasting Lipid Panel: No results found for this basename: CHOL,HDL,LDLCALC,TRIG,CHOLHDL,LDLDIRECT in the last 72 hours Thyroid Function Tests:  Basename 02/17/12 1913  TSH 2.623  T4TOTAL --  FREET4 --  T3FREE --  THYROIDAB --   Anemia Panel:  Basename 02/17/12 2124  VITAMINB12 --  FOLATE --  FERRITIN <1*  TIBC --  IRON --  RETICCTPCT --   Coagulation:  Basename 02/17/12 2126  LABPROT 14.4  INR 1.14   Urine Drug Screen: Drugs of Abuse  No results found for this basename: labopia,  cocainscrnur,  labbenz,  amphetmu,  thcu,  labbarb    Alcohol Level: No results found for this basename: ETH:2 in the last 72 hours Urinalysis:  Basename 02/17/12 1936  COLORURINE YELLOW  LABSPEC 1.020  PHURINE 6.0  GLUCOSEU NEGATIVE  HGBUR NEGATIVE  BILIRUBINUR NEGATIVE  KETONESUR NEGATIVE  PROTEINUR NEGATIVE  UROBILINOGEN 0.2  NITRITE NEGATIVE  LEUKOCYTESUR NEGATIVE   Misc. Labs:   Micro: No results found for this or any previous visit (from the past 240 hour(s)).  Studies/Results: No results found.  Medications:  Scheduled:    . clarithromycin  500 mg Oral Q12H  . ferrous sulfate  325 mg Oral  TID WC  . folic acid  1 mg Oral Daily  . hydrocortisone  25 mg Rectal QHS  . metroNIDAZOLE  500 mg Oral Q12H  . pantoprazole  40 mg Oral BID AC  . senna-docusate  1 tablet Oral BID  . sodium chloride  3 mL Intravenous Q12H  . sodium chloride       Continuous:  YNW:[id number], ondansetron (ZOFRAN) IV, ondansetron, traZODone  Assessment: Active Problems:  Fatigue  Obesity  Hematochezia  Iron deficiency anemia due to chronic blood loss  Helicobacter pylori gastritis (chronic gastritis)  Hemorrhoids, external   1. Severe iron deficiency anemia secondary to chronic blood loss. Status post 3 units of  packed red blood cell transfusions. IV iron was given overnight as well. Followup hemoglobin is is 7.6. She is less symptomatic, and therefore, we'll not transfuse any further.  External hemorrhoids with bleeding/hematochezia, likely the source of her severe anemia. Dr. Patty Sermons assessment noted and appreciated. Agree that the patient would need to have definitive treatment of these hemorrhoids that appear to be causing severe anemia from periodic bleeding. General surgery has been consulted. Continue Senokot S. and Anusol-HC started yesterday.  History of H. pylori gastritis. The patient was noncompliant with therapy because of the cost of treatment. Treatment has been started in the hospital. Case management will assist with medications upon discharge.  Fatigue secondary to #1. Resolving.   Plan:  1. Await general surgery's consultation and recommendation. 2. We'll start oral iron. 3. Continue supportive treatment.    LOS: 2 days   Annajulia Lewing 02/19/2012, 11:13 AM

## 2012-02-19 NOTE — H&P (Signed)
Traci [name] is an 46 y.o. female.   Chief Complaint: Abdominal pain HPI: Patient presented to [facility] with increasing RLQ pain.  Pain has been present intermittently over the last couple months.  Pain worse with movements.  No change in BM.  Some nausea with pain.  Has had prior C-scope with Dr. Katrinka Blazing.  No recent scope.  No significant family history of GI cancer.  Pain is actually much improved now.  Tolerating clears.  Past Medical History  Diagnosis Date  . S/P colonoscopy 04/2007    Dr. Mann-hemorrhoids (int/ext)  . Hemorrhoids   . Asthma     as a child  . Iron deficiency anemia due to chronic blood loss 02/17/2012    Hb 4.4  . Helicobacter pylori gastritis (chronic gastritis) 11/20012    Not tx. Loss to f/up  . Hemorrhoids, external 03/2011    Past Surgical History  Procedure Date  . Right thumb   . Colonoscopy 2008  . Colonoscopy 03/2011    Inflammed external and anal hemorrhoids  . Esophagogastroduodenoscopy 16109    Gastric erosions. HP positive    Family History  Problem Relation Age of Onset  . Cirrhosis Father   . Aneurysm Mother   . Colon cancer Neg Hx    Social History:  reports that she has never smoked. She does not have any smokeless tobacco history on file. She reports that she does not drink alcohol or use illicit drugs.  Allergies:  Allergies  Allergen Reactions  . Penicillins Nausea And Vomiting    Medications Prior to Admission  Medication Sig Dispense Refill  . Cyanocobalamin (B-12 PO) Take 1 tablet by mouth daily.      . naproxen sodium (ALEVE) 220 MG tablet Take 220-440 mg by mouth daily as needed. For pain      . shark liver oil-cocoa butter (HEMORRHOIDAL) 0.25-3-85.5 % suppository Place 1 suppository rectally as needed. Hemorrhoidal Pain       . acetaminophen (ACETAMINOPHEN EXTRA STRENGTH) 500 MG tablet Take 500 mg by mouth every 6 (six) hours as needed. pain         Results for orders placed during the hospital encounter of 02/17/12 (from  the past 48 hour(s))  BASIC METABOLIC PANEL     Status: Abnormal   Collection Time   02/18/12  9:16 AM      Component Value Range Comment   Sodium 138  135 - 145 mEq/L    Potassium 3.9  3.5 - 5.1 mEq/L    Chloride 103  96 - 112 mEq/L    CO2 26  19 - 32 mEq/L    Glucose, Bld 110 (*) 70 - 99 mg/dL    BUN 14  6 - 23 mg/dL    Creatinine, Ser 6.04  0.50 - 1.10 mg/dL    Calcium 9.6  8.4 - 54.0 mg/dL    GFR calc non Af Amer >90  >90 mL/min    GFR calc Af Amer >90  >90 mL/min   CBC     Status: Abnormal   Collection Time   02/18/12  9:16 AM      Component Value Range Comment   WBC 6.1  4.0 - 10.5 K/uL    RBC 3.95  3.87 - 5.11 MIL/uL    Hemoglobin 7.4 (*) 12.0 - 15.0 g/dL DELTA CHECK NOTED   HCT 25.0 (*) 36.0 - 46.0 %    MCV 63.3 (*) 78.0 - 100.0 fL DELTA CHECK NOTED   MCH 18.7 (*) 26.0 -  34.0 pg    MCHC 29.6 (*) 30.0 - 36.0 g/dL    RDW 16.1 (*) 09.6 - 15.5 %    Platelets 311  150 - 400 K/uL   CBC     Status: Abnormal   Collection Time   02/19/12  5:48 AM      Component Value Range Comment   WBC 7.4  4.0 - 10.5 K/uL    RBC 4.09  3.87 - 5.11 MIL/uL    Hemoglobin 7.6 (*) 12.0 - 15.0 g/dL    HCT 04.5 (*) 40.9 - 46.0 %    MCV 64.1 (*) 78.0 - 100.0 fL    MCH 18.6 (*) 26.0 - 34.0 pg    MCHC 29.0 (*) 30.0 - 36.0 g/dL    RDW 81.1 (*) 91.4 - 15.5 %    Platelets 339  150 - 400 K/uL   BASIC METABOLIC PANEL     Status: Abnormal   Collection Time   02/19/12  5:48 AM      Component Value Range Comment   Sodium 138  135 - 145 mEq/L    Potassium 4.0  3.5 - 5.1 mEq/L    Chloride 105  96 - 112 mEq/L    CO2 25  19 - 32 mEq/L    Glucose, Bld 109 (*) 70 - 99 mg/dL    BUN 13  6 - 23 mg/dL    Creatinine, Ser 7.82  0.50 - 1.10 mg/dL    Calcium 9.6  8.4 - 95.6 mg/dL    GFR calc non Af Amer 79 (*) >90 mL/min    GFR calc Af Amer >90  >90 mL/min    No results found.  Review of Systems  Constitutional: Negative for fever and chills.  HENT: Negative.   Eyes: Negative.   Respiratory: Negative.     Cardiovascular: Negative.   Gastrointestinal: Positive for nausea and abdominal pain (Improved now but was having RLQ pain.  ). Negative for heartburn, vomiting, diarrhea, constipation, blood in stool and melena.  Genitourinary: Negative.   Musculoskeletal: Negative.   Skin: Negative.   Neurological: Negative.   Endo/Heme/Allergies: Negative.   Psychiatric/Behavioral: Negative.     Blood pressure 119/69, pulse 67, temperature 98.3 F (36.8 C), temperature source Oral, resp. rate 18, height 5\' 10"  (1.778 m), weight 111.585 kg (246 lb), SpO2 98.00%. Physical Exam  Constitutional: She is oriented to person, place, and time. She appears well-developed and well-nourished. No distress.  HENT:  Head: Normocephalic and atraumatic.  Eyes: Conjunctivae normal and EOM are normal. Pupils are equal, round, and reactive to light. No scleral icterus.  Neck: Normal range of motion. Neck supple. No tracheal deviation present. No thyromegaly present.  Cardiovascular: Normal rate, regular rhythm and normal heart sounds.   Respiratory: Effort normal. No respiratory distress. She has no wheezes.  GI: Soft. Bowel sounds are normal. She exhibits no distension and no mass. There is tenderness (Mild RLQ tenderness.  No peritoneal signs.  ). There is no rebound and no guarding.  Lymphadenopathy:    She has no cervical adenopathy.  Neurological: She is alert and oriented to person, place, and time.  Skin: Skin is warm and dry.     Assessment/Plan Acute diverticulitis.  Continue abx for 24 hours.  Slow advancement of diet.  Patient will need c-scope in the near future however unlikely to get scheduled tomorrow so will plan to schedule as an outpatient.    Ilir Mahrt C 02/19/2012, 10:29 PM

## 2012-02-19 NOTE — Consult Note (Signed)
NAMEMarland [name]  Traci [name], DOMBROSKY NO.:  [id number]  MEDICAL RECORD NO.:  [id number]  LOCATION:  A311                          FACILITY:  [facility]  PHYSICIAN:  Lionel December, M.D.    DATE OF BIRTH:  04/17/1966  DATE OF CONSULTATION:  02/18/2012 DATE OF DISCHARGE:                                CONSULTATION   REASON FOR CONSULTATION:  Recurrent iron deficiency anemia in patient with chronic hematochezia, felt to be secondary to hemorrhoids.  HISTORY OF PRESENT ILLNESS:  The patient is a 46 year old African American female who has history of chronic hematochezia and iron deficiency anemia, who was last evaluated in November 2012, details of which I reviewed under past medical history.  She presented to emergency room with progressive weakness, lightheadedness, and vertigo.  In emergency room, she was noted to have hemoglobin of 4.4 and hematocrit of 16.9.  Her MCV is 54.  The patient was therefore admitted to Hospitalist Service.  The patient has received 3 units of PRBCs and feels a lot better.  The patient states she has been bleeding off and on from her hemorrhoids for the last 5 years.  She had her first colonoscopy by Dr. [name] in December 2008 in [place] and was noted to have internal and external hemorrhoids felt to be source of the patient's bleeding.  She was treated with Anusol-HC suppositories and advised fiber diet and stool softeners.  However, the patient has continued to bleed per rectum on regular basis.  She was admitted to this facility back in September 2012 for profound anemia with a hemoglobin of 4.7.  She was given 5 units of PRBCs.  She was evaluated by Dr. [name] on an outpatient basis as discussed below.  The patient states that she bleeds very often if not daily with her bowel movements.  She passes anywhere from small to large amount of bright red blood per rectum.  She denies diarrhea and/or constipation. Her bowels generally move daily or  every other day.  Her weakness started about 6 weeks ago and has been gradually progressive.  With onset of her weakness, she decided to go back on iron pills, but could not tell any difference.  Before she came to the hospital, she did notice self-limiting diarrhea and pain across her upper abdomen but these symptoms have resolved.  She takes Aleve no more than every other day.  She has been advised not to take NSAIDs in the past.  She has not had any period in 4 years.  She also is craving for ice.  She denies weight loss.  She believes she has gained 15 pounds this year.  CURRENT MEDICATIONS: 1. Biaxin 500 mg p.o. b.i.d. 2. Folic acid 1 mg p.o. b.i.d. 3. Anusol-HC suppository 1 per rectum at bedtime. 4. Metronidazole 500 mg p.o. b.i.d. 5. Pantoprazole 40 mg p.o. b.i.d. 6. Senokot 1 tablet p.o. b.i.d.  P.r.n. medications include acetaminophen, ondansetron, and trazodone.  PAST MEDICAL HISTORY:  Chronic hematochezia felt to be secondary to hemorrhoids.  Initial colonoscopy was in December 2008 as above.  More recently, she was evaluated by Dr. [name] in November 2012 and had EGD and colonoscopy.  EGD revealed small sliding hiatal  hernia and erosive gastritis.  Biopsy showed H. pylori gastritis but unfortunately she has not followed up with Dr. [name] office and has not been treated.  She was begun on Biaxin and metronidazole by Dr. Sherrie Mustache today for HP therapy.  Following EGD, she also had a colonoscopy, which revealed external hemorrhoids which were quite friable.  I have reviewed colonoscopy pictures and this finding was well documented.  The patient has not followed up because of lack of insurance.  She had surgery for fracture in her right thumb about 20 years ago. History of asthma but no problems lately.  Obesity.  ALLERGIES:  PENICILLIN causing nausea and vomiting.  FAMILY HISTORY:  Father died of alcoholic cirrhosis at age 68.  Mother died at 61 of ruptured  intracranial aneurysm at age 42.  She has 2 brothers and 1 sister and they are in good health.  SOCIAL HISTORY:  She is single.  She is never married and does not have any children.  She worked in [employer] for over 20 years but lost the job because of outsourcing she states and now she works in [employer] part-time.  She does not smoke cigarettes or drink alcohol.  OBJECTIVE:  VITAL SIGNS:  Weight 226 pounds, she is 69 inches tall. Pulse 70 per minute, blood pressure 119/81, respirations 19, and temp is 98.4. HEENT:  Conjunctivae are pink.  Sclerae nonicteric.  Oropharyngeal mucosa is normal.  No neck masses or thyromegaly noted. CARDIAC:  Regular rhythm.  Normal S1 and S2.  No murmur or gallop noted. LUNGS:  Clear to auscultation. ABDOMEN:  Full but soft and nontender without organomegaly or masses. No clubbing, koilonychia, or peripheral edema is noted. NEUROLOGIC:  The patient is alert and appears to be quite comfortable.  LABORATORY DATA:  From admission, WBC 7.6, H and H 4.4 and 16.9, MCV 54. Metabolic 7 within normal limits.  TSH 2.63.  Serum ferritin less than 1.  H and H from this morning after 3 units is 7.4 and 25.0.  ASSESSMENT:  The patient is a 46 year old [place] American female, who has well documented iron deficiency anemia, felt to be secondary to chronic rectal bleeding.  Her presentation clearly is unique that she has had profound anemia due to bleeding.  She needs to have surgical intervention and this problem could be easily fixed.  Unfortunately the patient has not followed up recommendations.  I do not believe we have to look for an additional reason for her iron deficiency anemia.  Once her hematochezia stops and she remains with iron deficiency, she may need small bowel study.  There is nothing in the history that suggests small-bowel process.  History of H. pylori gastritis.  Treatment was initiated today.  She need 10 days of  therapy.  RECOMMENDATIONS:  The patient advised that she should not take Aleve. She should take Tylenol and if she must take NSAIDs, she should take 1-2 Advil at a time with food.  The patient advised to take ferrous sulfate 325 mg p.o. b.i.d. until she is advised by her physician to discontinue.  I would recommend a surgical consultation during this hospitalization. Otherwise we may miss opportunity to address source of for IDA.  We appreciate the opportunity to participate in care of this nice lady.          ______________________________ Lionel December, M.D.     NR/MEDQ  D:  02/18/2012  T:  02/19/2012  Job:  [id number]

## 2012-02-20 ENCOUNTER — Encounter (HOSPITAL_COMMUNITY): Payer: Self-pay | Admitting: Anesthesiology

## 2012-02-20 ENCOUNTER — Encounter (HOSPITAL_COMMUNITY)
Admission: EM | Disposition: A | Discharge status: Home / Self Care | Payer: Self-pay | Source: Home / Self Care | Attending: Internal Medicine

## 2012-02-20 ENCOUNTER — Encounter (HOSPITAL_COMMUNITY): Payer: Self-pay | Admitting: *Deleted

## 2012-02-20 ENCOUNTER — Inpatient Hospital Stay (HOSPITAL_COMMUNITY): Payer: Self-pay | Admitting: Anesthesiology

## 2012-02-20 HISTORY — PX: HEMORRHOID SURGERY: SHX153

## 2012-02-20 LAB — HEMOGLOBIN AND HEMATOCRIT, BLOOD
HCT: 29.1 % — ABNORMAL LOW (ref 36.0–46.0)
Hemoglobin: 8.6 g/dL — ABNORMAL LOW (ref 12.0–15.0)

## 2012-02-20 LAB — CBC WITH DIFFERENTIAL/PLATELET
Basophils Absolute: 0 10*3/uL (ref 0.0–0.1)
Basophils Relative: 0 % (ref 0–1)
Hemoglobin: 7.7 g/dL — ABNORMAL LOW (ref 12.0–15.0)
Lymphocytes Relative: 19 % (ref 12–46)
MCHC: 28.7 g/dL — ABNORMAL LOW (ref 30.0–36.0)
Neutro Abs: 6.1 10*3/uL (ref 1.7–7.7)
Neutrophils Relative %: 73 % (ref 43–77)
RDW: 31.7 % — ABNORMAL HIGH (ref 11.5–15.5)
WBC: 8.3 10*3/uL (ref 4.0–10.5)

## 2012-02-20 LAB — SURGICAL PCR SCREEN
MRSA, PCR: NEGATIVE
Staphylococcus aureus: NEGATIVE

## 2012-02-20 LAB — BASIC METABOLIC PANEL
CO2: 25 mEq/L (ref 19–32)
Chloride: 107 mEq/L (ref 96–112)
GFR calc Af Amer: >90 mL/min (ref >90)
Potassium: 4.3 mEq/L (ref 3.5–5.1)
Sodium: 140 mEq/L (ref 135–145)

## 2012-02-20 LAB — PREPARE RBC (CROSSMATCH)

## 2012-02-20 SURGERY — HEMORRHOIDECTOMY
Anesthesia: General | Site: Rectum | Wound class: Contaminated

## 2012-02-20 MED ORDER — HYDROMORPHONE HCL PF 1 MG/ML IJ SOLN
0.5000 mg | INTRAMUSCULAR | Status: AC | PRN
  Administered 2012-02-20 (×4): 0.5 mg via INTRAVENOUS

## 2012-02-20 MED ORDER — SODIUM CHLORIDE 0.9 % IV SOLN
INTRAVENOUS | Status: DC | PRN
  Administered 2012-02-20: 11:00:00 via INTRAVENOUS

## 2012-02-20 MED ORDER — BUPIVACAINE-EPINEPHRINE PF 0.5-1:200000 % IJ SOLN
INTRAMUSCULAR | Status: AC
  Filled 2012-02-20: qty 10

## 2012-02-20 MED ORDER — BUPIVACAINE-EPINEPHRINE PF 0.5-1:200000 % IJ SOLN
INTRAMUSCULAR | Status: DC | PRN
  Administered 2012-02-20: 10 mL

## 2012-02-20 MED ORDER — HYDROCODONE-ACETAMINOPHEN 5-325 MG PO TABS
1.0000 | ORAL_TABLET | ORAL | Status: DC | PRN
  Administered 2012-02-20 – 2012-02-21 (×5): 2 via ORAL
  Filled 2012-02-20 (×5): qty 2

## 2012-02-20 MED ORDER — BUPIVACAINE HCL (PF) 0.5 % IJ SOLN
INTRAMUSCULAR | Status: AC
  Filled 2012-02-20: qty 30

## 2012-02-20 MED ORDER — LIDOCAINE HCL 1 % IJ SOLN
INTRAMUSCULAR | Status: DC | PRN
  Administered 2012-02-20: 50 mg via INTRADERMAL

## 2012-02-20 MED ORDER — DEXTROSE 5 % IV SOLN
1.0000 g | INTRAVENOUS | Status: DC | PRN
  Administered 2012-02-20: 2 g via INTRAVENOUS

## 2012-02-20 MED ORDER — PROPOFOL 10 MG/ML IV BOLUS
INTRAVENOUS | Status: DC | PRN
  Administered 2012-02-20: 150 mg via INTRAVENOUS

## 2012-02-20 MED ORDER — LIDOCAINE VISCOUS 2 % MT SOLN
OROMUCOSAL | Status: AC
  Filled 2012-02-20: qty 15

## 2012-02-20 MED ORDER — MIDAZOLAM HCL 2 MG/2ML IJ SOLN
1.0000 mg | INTRAMUSCULAR | Status: DC | PRN
  Administered 2012-02-20: 2 mg via INTRAVENOUS

## 2012-02-20 MED ORDER — ONDANSETRON HCL 4 MG/2ML IJ SOLN
4.0000 mg | Freq: Once | INTRAMUSCULAR | Status: AC
  Administered 2012-02-20: 4 mg via INTRAVENOUS

## 2012-02-20 MED ORDER — HYDROCORTISONE ACETATE 25 MG RE SUPP
25.0000 mg | Freq: Every day | RECTAL | Status: DC
  Filled 2012-02-20 (×2): qty 1

## 2012-02-20 MED ORDER — LACTATED RINGERS IV SOLN
INTRAVENOUS | Status: DC
  Administered 2012-02-20: 11:00:00 via INTRAVENOUS

## 2012-02-20 MED ORDER — DEXTROSE 5 % IV SOLN
INTRAVENOUS | Status: AC
  Filled 2012-02-20: qty 2

## 2012-02-20 MED ORDER — MIDAZOLAM HCL 2 MG/2ML IJ SOLN
INTRAMUSCULAR | Status: AC
  Filled 2012-02-20: qty 4

## 2012-02-20 MED ORDER — LABETALOL HCL 5 MG/ML IV SOLN
INTRAVENOUS | Status: DC | PRN
  Administered 2012-02-20 (×2): 5 mg via INTRAVENOUS
  Administered 2012-02-20: 10 mg via INTRAVENOUS

## 2012-02-20 MED ORDER — SODIUM CHLORIDE 0.9 % IV SOLN
INTRAVENOUS | Status: DC
  Administered 2012-02-20: 11:00:00 via INTRAVENOUS

## 2012-02-20 MED ORDER — ONDANSETRON HCL 4 MG/2ML IJ SOLN
INTRAMUSCULAR | Status: AC
  Filled 2012-02-20: qty 2

## 2012-02-20 MED ORDER — PROPOFOL 10 MG/ML IV EMUL
INTRAVENOUS | Status: AC
  Filled 2012-02-20: qty 20

## 2012-02-20 MED ORDER — HYDROMORPHONE HCL PF 1 MG/ML IJ SOLN
INTRAMUSCULAR | Status: AC
  Filled 2012-02-20: qty 1

## 2012-02-20 MED ORDER — FENTANYL CITRATE 0.05 MG/ML IJ SOLN
INTRAMUSCULAR | Status: AC
  Filled 2012-02-20: qty 10

## 2012-02-20 MED ORDER — 0.9 % SODIUM CHLORIDE (POUR BTL) OPTIME
TOPICAL | Status: DC | PRN
  Administered 2012-02-20: 1000 mL

## 2012-02-20 MED ORDER — MIDAZOLAM HCL 5 MG/5ML IJ SOLN
INTRAMUSCULAR | Status: DC | PRN
  Administered 2012-02-20: 2 mg via INTRAVENOUS

## 2012-02-20 MED ORDER — LIDOCAINE VISCOUS 2 % MT SOLN
OROMUCOSAL | Status: DC | PRN
  Administered 2012-02-20: 20 mL

## 2012-02-20 MED ORDER — FENTANYL CITRATE 0.05 MG/ML IJ SOLN: 25.0000 ug | INTRAMUSCULAR | Status: DC | PRN

## 2012-02-20 MED ORDER — HYDROMORPHONE HCL PF 1 MG/ML IJ SOLN
0.5000 mg | INTRAMUSCULAR | Status: AC
  Administered 2012-02-20 (×4): 0.5 mg via INTRAVENOUS

## 2012-02-20 MED ORDER — ONDANSETRON HCL 4 MG/2ML IJ SOLN: 4.0000 mg | Freq: Once | INTRAMUSCULAR | Status: DC | PRN

## 2012-02-20 MED ORDER — GLYCOPYRROLATE 0.2 MG/ML IJ SOLN
INTRAMUSCULAR | Status: DC | PRN
  Administered 2012-02-20: 0.4 mg via INTRAVENOUS

## 2012-02-20 MED ORDER — NEOSTIGMINE METHYLSULFATE 1 MG/ML IJ SOLN
INTRAMUSCULAR | Status: DC | PRN
  Administered 2012-02-20: 3 mg via INTRAVENOUS

## 2012-02-20 MED ORDER — FENTANYL CITRATE 0.05 MG/ML IJ SOLN
INTRAMUSCULAR | Status: DC | PRN
  Administered 2012-02-20 (×2): 50 ug via INTRAVENOUS
  Administered 2012-02-20: 100 ug via INTRAVENOUS
  Administered 2012-02-20: 50 ug via INTRAVENOUS

## 2012-02-20 MED ORDER — HYDROMORPHONE HCL PF 1 MG/ML IJ SOLN
INTRAMUSCULAR | Status: AC
Start: 2012-02-20 — End: 2012-02-20
  Filled 2012-02-20: qty 1

## 2012-02-20 MED ORDER — MIDAZOLAM HCL 2 MG/2ML IJ SOLN
INTRAMUSCULAR | Status: AC
  Filled 2012-02-20: qty 2

## 2012-02-20 MED ORDER — ROCURONIUM BROMIDE 100 MG/10ML IV SOLN
INTRAVENOUS | Status: DC | PRN
  Administered 2012-02-20: 35 mg via INTRAVENOUS

## 2012-02-20 MED ORDER — ROCURONIUM BROMIDE 50 MG/5ML IV SOLN
INTRAVENOUS | Status: AC
  Filled 2012-02-20: qty 1

## 2012-02-20 MED ORDER — CELECOXIB 100 MG PO CAPS
200.0000 mg | ORAL_CAPSULE | Freq: Two times a day (BID) | ORAL | Status: DC
  Administered 2012-02-20 – 2012-02-21 (×3): 200 mg via ORAL
  Filled 2012-02-20 (×3): qty 2

## 2012-02-20 SURGICAL SUPPLY — 32 items
BAG HAMPER (MISCELLANEOUS) ×2 IMPLANT
CLOTH BEACON ORANGE TIMEOUT ST (SAFETY) ×2 IMPLANT
COVER LIGHT HANDLE STERIS (MISCELLANEOUS) ×4 IMPLANT
DECANTER SPIKE VIAL GLASS SM (MISCELLANEOUS) ×2 IMPLANT
DRAPE PROXIMA HALF (DRAPES) ×2 IMPLANT
DRESSING GELFORM 100 (GAUZE/BANDAGES/DRESSINGS) ×1 IMPLANT
ELECT REM PT RETURN 9FT ADLT (ELECTROSURGICAL) ×2
ELECTRODE REM PT RTRN 9FT ADLT (ELECTROSURGICAL) ×1 IMPLANT
FORMALIN 10 PREFIL 120ML (MISCELLANEOUS) ×1 IMPLANT
GLOVE BIOGEL PI IND STRL 7.5 (GLOVE) ×1 IMPLANT
GLOVE BIOGEL PI INDICATOR 7.5 (GLOVE) ×1
GLOVE ECLIPSE 6.5 STRL STRAW (GLOVE) ×1 IMPLANT
GLOVE ECLIPSE 7.0 STRL STRAW (GLOVE) ×3 IMPLANT
GLOVE ECLIPSE 7.5 STRL STRAW (GLOVE) ×1 IMPLANT
GLOVE INDICATOR 7.0 STRL GRN (GLOVE) ×1 IMPLANT
GLOVE INDICATOR 7.5 STRL GRN (GLOVE) ×1 IMPLANT
GOWN STRL REIN XL XLG (GOWN DISPOSABLE) ×4 IMPLANT
HEMOSTAT SURGICEL 4X8 (HEMOSTASIS) ×3 IMPLANT
KIT ROOM TURNOVER APOR (KITS) ×2 IMPLANT
MANIFOLD NEPTUNE II (INSTRUMENTS) ×2 IMPLANT
NDL HYPO 25X1 1.5 SAFETY (NEEDLE) ×1 IMPLANT
NEEDLE HYPO 25X1 1.5 SAFETY (NEEDLE) ×2 IMPLANT
NS IRRIG 1000ML POUR BTL (IV SOLUTION) ×2 IMPLANT
PACK PERI GYN (CUSTOM PROCEDURE TRAY) ×2 IMPLANT
PAD ARMBOARD 7.5X6 YLW CONV (MISCELLANEOUS) ×2 IMPLANT
SET BASIN LINEN APH (SET/KITS/TRAYS/PACK) ×2 IMPLANT
SPONGE GAUZE 4X4 12PLY (GAUZE/BANDAGES/DRESSINGS) ×2 IMPLANT
SUT CHROMIC 2 0 CT 1 (SUTURE) ×1 IMPLANT
SUT CHROMIC 3 0 SH 27 (SUTURE) ×1 IMPLANT
SUT SILK 0 FSL (SUTURE) ×2 IMPLANT
SYR CONTROL 10ML LL (SYRINGE) ×2 IMPLANT
TOWEL OR 17X26 4PK STRL BLUE (TOWEL DISPOSABLE) ×2 IMPLANT

## 2012-02-20 NOTE — Transfer of Care (Signed)
Immediate Anesthesia Transfer of Care Note  Patient: Traci [name]  Procedure(s) Performed: Procedure(s) (LRB) with comments: HEMORRHOIDECTOMY (N/A)  Patient Location: PACU  Anesthesia Type: General  Level of Consciousness: awake and patient cooperative  Airway & Oxygen Therapy: Patient Spontanous Breathing and Patient connected to face mask oxygen  Post-op Assessment: Report given to PACU RN, Post -op Vital signs reviewed and stable and Patient moving all extremities  Post vital signs: Reviewed and stable  Complications: No apparent anesthesia complications

## 2012-02-20 NOTE — Progress Notes (Signed)
Patient returns from surgery, report received that 1 unit of blood was given during surgery. Orders states to transfuse 2 units of blood. Dr. Leticia Penna notified for clarification, Hg 8.6. HCT 29.1. Order to not transfuse no more blood.

## 2012-02-20 NOTE — Anesthesia Procedure Notes (Signed)
Procedure Name: Intubation Date/Time: 02/20/2012 11:44 AM Performed by: Despina Hidden Pre-anesthesia Checklist: Suction available, Emergency Drugs available, Patient identified and Patient being monitored Patient Re-evaluated:Patient Re-evaluated prior to inductionOxygen Delivery Method: Circle system utilized Preoxygenation: Pre-oxygenation with 100% oxygen Intubation Type: IV induction and Cricoid Pressure applied Ventilation: Mask ventilation without difficulty Laryngoscope Size: Mac and 3 Grade View: Grade I Tube type: Oral Tube size: 7.0 mm Number of attempts: 1 Airway Equipment and Method: Stylet Placement Confirmation: ETT inserted through vocal cords under direct vision,  positive ETCO2 and breath sounds checked- equal and bilateral Secured at: 22 cm Tube secured with: Tape Dental Injury: Teeth and Oropharynx as per pre-operative assessment

## 2012-02-20 NOTE — Progress Notes (Signed)
Subjective: She has no complaints. She is sitting up in a chair. She had a bowel movement overnight without any blood seen.  Objective: Vital signs in last 24 hours: Filed Vitals:   02/19/12 1600 02/19/12 2021 02/20/12 0213 02/20/12 0539  BP: 142/76 119/69 118/76 118/66  Pulse: 73 67 68 64  Temp: 98.6 F (37 C) 98.3 F (36.8 C) 98 F (36.7 C) 98.3 F (36.8 C)  TempSrc: Oral Oral Oral Oral  Resp: 18 18 20 17   Height:      Weight:    111.812 kg (246 lb 8 oz)  SpO2: 100% 98% 100% 100%    Intake/Output Summary (Last 24 hours) at 02/20/12 0951 Last data filed at 02/19/12 1827  Gross per 24 hour  Intake    480 ml  Output      0 ml  Net    480 ml    Weight change: 9.012 kg (19 lb 13.9 oz)  Physical exam: General: Pleasant overweight 46 year old African-American woman sitting up in a chair, in no acute distress. Lungs: Clear to auscultation bilaterally. Heart: S1, S2, with a 1-2/6 systolic murmur. Abdomen: Positive bowel sounds, soft, nontender, nondistended. Extremities: No pedal edema.  Lab Results: Basic Metabolic Panel:  Basename 02/20/12 0506 02/19/12 0548  NA 140 138  K 4.3 4.0  CL 107 105  CO2 25 25  GLUCOSE 96 109*  BUN 11 13  CREATININE 0.79 0.86  CALCIUM 9.5 9.6  MG -- --  PHOS -- --   Liver Function Tests: No results found for this basename: AST:2,ALT:2,ALKPHOS:2,BILITOT:2,PROT:2,ALBUMIN:2 in the last 72 hours No results found for this basename: LIPASE:2,AMYLASE:2 in the last 72 hours No results found for this basename: AMMONIA:2 in the last 72 hours CBC:  Basename 02/20/12 0506 02/19/12 0548 02/17/12 1913  WBC 8.3 7.4 --  NEUTROABS 6.1 -- 5.1  HGB 7.7* 7.6* --  HCT 26.8* 26.2* --  MCV 65.0* 64.1* --  PLT 286 339 --   Cardiac Enzymes: No results found for this basename: CKTOTAL:3,CKMB:3,CKMBINDEX:3,TROPONINI:3 in the last 72 hours BNP: No results found for this basename: PROBNP:3 in the last 72 hours D-Dimer: No results found for this  basename: DDIMER:2 in the last 72 hours CBG: No results found for this basename: GLUCAP:6 in the last 72 hours Hemoglobin A1C: No results found for this basename: HGBA1C in the last 72 hours Fasting Lipid Panel: No results found for this basename: CHOL,HDL,LDLCALC,TRIG,CHOLHDL,LDLDIRECT in the last 72 hours Thyroid Function Tests:  Basename 02/17/12 1913  TSH 2.623  T4TOTAL --  FREET4 --  T3FREE --  THYROIDAB --   Anemia Panel:  Basename 02/17/12 2124  VITAMINB12 --  FOLATE --  FERRITIN <1*  TIBC --  IRON --  RETICCTPCT --   Coagulation:  Basename 02/17/12 2126  LABPROT 14.4  INR 1.14   Urine Drug Screen: Drugs of Abuse  No results found for this basename: labopia,  cocainscrnur,  labbenz,  amphetmu,  thcu,  labbarb    Alcohol Level: No results found for this basename: ETH:2 in the last 72 hours Urinalysis:  Basename 02/17/12 1936  COLORURINE YELLOW  LABSPEC 1.020  PHURINE 6.0  GLUCOSEU NEGATIVE  HGBUR NEGATIVE  BILIRUBINUR NEGATIVE  KETONESUR NEGATIVE  PROTEINUR NEGATIVE  UROBILINOGEN 0.2  NITRITE NEGATIVE  LEUKOCYTESUR NEGATIVE   Misc. Labs:   Micro: Recent Results (from the past 240 hour(s))  SURGICAL PCR SCREEN     Status: Normal   Collection Time   02/19/12  6:33 PM  Component Value Range Status Comment   MRSA, PCR NEGATIVE  NEGATIVE Final    Staphylococcus aureus NEGATIVE  NEGATIVE Final     Studies/Results: No results found.  Medications:  Scheduled:    . cefOXitin  2 g Intravenous 60 min Pre-Op  . clarithromycin  500 mg Oral Q12H  . ferrous sulfate  325 mg Oral TID WC  . folic acid  1 mg Oral Daily  . hydrocortisone  25 mg Rectal QHS  . metroNIDAZOLE  500 mg Oral Q12H  . pantoprazole  40 mg Oral BID AC  . senna-docusate  1 tablet Oral BID  . sodium chloride  3 mL Intravenous Q12H   Continuous:  [id number]:[id number], ondansetron (ZOFRAN) IV, ondansetron, traZODone  Assessment: Active Problems:  Fatigue  Obesity   Hematochezia  Iron deficiency anemia due to chronic blood loss  Helicobacter pylori gastritis (chronic gastritis)  Hemorrhoids, external   1. Severe iron deficiency anemia secondary to chronic blood loss. Status post 3 units of packed red blood cell transfusions. IV iron was given as well. Followup hemoglobin is is 7.7. She is less symptomatic, and therefore, we'll not transfuse any further.  External hemorrhoids with bleeding/hematochezia, likely the source of her severe anemia. Dr. Patty Sermons assessment noted and appreciated. Agree that the patient would need to have definitive treatment of these hemorrhoids that appear to be causing severe anemia from periodic bleeding. Dr. Dian Situ has seen her and the patient is undergoing surgery currently.. Continue Senokot S. and Anusol-HC started yesterday.  History of H. pylori gastritis. The patient was noncompliant with therapy because of the cost of treatment. Treatment has been started in the hospital. Case management will assist with medications upon discharge.  Fatigue secondary to #1. Resolved.   Plan:  1. Continue management as above. 2. Disposition per the recommendations of general surgery.    LOS: 3 days   Jrue Jarriel 02/20/2012, 9:51 AM

## 2012-02-20 NOTE — Op Note (Signed)
Patient:  Traci [name]  DOB:  1965/11/01  MRN:  [id number]   Preop Diagnosis:  Prolapse stage 4 complicated internal hemorrhoids  Postop Diagnosis:  The same  Procedure:  Hemorrhoidectomy (2 columns)  Surgeon:  Dr. Tilford Pillar  Anes:  General endotracheal, 1% lidocaine with epinephrine  Indications:  Patient is a 46 year old female presented to [facility] a history of long-standing chronic anemia. She's undergone extensive workup regarding her anemia from a GI standpoint with no evidence other than notable hemorrhoidal disease. Based on her negative workup to date it was recommended patient undergo hemorrhoidectomy for medically refractory hemorrhoidal disease. The risks benefits alternatives were discussed at length the patient including postoperative pain. Her questions and concerns were addressed the patient was consented for planned procedure.  Procedure note:  Patient is taken to the operator is placed in supine position on the operating table time the general anesthetic is a Optician, dispensing. Once patient was asleep symmetrically and about the nurse anesthetist. At this point she's placed in bilateral yellow fin stirrups and high without any position. Her perineum and rectum were prepped with Betadine solution and draped in standard fashion. An Allis clamp was utilized to grasp the prominent hemorrhoidal column. There are 2 columns noted to be enlarged with the third being relatively normal in caliber. At this point I did elliptically incise the mucosa and perianal skin with electrocautery during the dissection time to the column of hemorrhoidal tissue. The hemorrhoid was excised. Due to its large size I did take this out somewhat in piecemeal fashion. There were several well vascularized areas were quickly controlled with chromic suture. I did reapproximate the mucosal lining with a running locking 3-0 chromic. This controlled hemostasis I turned my attention to the second column. The  mucosa again was scored with the electrocautery and the hemorrhoidal tissue was excised. The mucosa was then closed again using a 3-0 chromic. Hemostasis was excellent. Rectal tone was palpated and was noted to be normal. There were no other abnormalities noted. As quite pleased with the appearance of the closure. On digital rectal exam no additional masses or abnormalities are noted. The local anesthetic was instilled. A piece of Surgicel was then utilized to wrap a piece of Gelfoam in created into the form of tampon. A silk sutures utilized for retrieval. This is placed into the rectum. The perianal area was washed with a moist dry towel. 4 x 4 gauze dressings were then rolled and placed over the anal opening to secure the tampon and act as temporary dressing. This was taped in position with Medipore tape and finally secured with mesh underwear. The patient was taken back to the PACU in stable condition. At the conclusion of the procedure all instrument, sponge, needle counts are correct. Patient tolerated procedure well.  Complications:  None apparent  EBL:  Less than 150 ML's  Specimen:  Hemorrhoidal tissue

## 2012-02-20 NOTE — Anesthesia Postprocedure Evaluation (Signed)
  Anesthesia Post-op Note  Patient: Traci [name]  Procedure(s) Performed: Procedure(s) (LRB) with comments: HEMORRHOIDECTOMY (N/A)  Patient Location: PACU  Anesthesia Type: General  Level of Consciousness: awake, alert , oriented and patient cooperative  Airway and Oxygen Therapy: Patient Spontanous Breathing  Post-op Pain: 5 /10, moderate  Post-op Assessment: Post-op Vital signs reviewed, Patient's Cardiovascular Status Stable, Respiratory Function Stable, Patent Airway, No signs of Nausea or vomiting and Pain level controlled  Post-op Vital Signs: Reviewed and stable  Complications: No apparent anesthesia complications

## 2012-02-20 NOTE — Progress Notes (Signed)
Day of Surgery  Subjective: No acute change.  Objective: Vital signs in last 24 hours: Temp:  [98 F (36.7 C)-98.7 F (37.1 C)] 98.3 F (36.8 C) (09/27 0539) Pulse Rate:  [64-78] 64  (09/27 0539) Resp:  [16-20] 17  (09/27 0539) BP: (117-142)/(66-76) 118/66 mmHg (09/27 0539) SpO2:  [98 %-100 %] 100 % (09/27 0539) Weight:  [111.812 kg (246 lb 8 oz)] 111.812 kg (246 lb 8 oz) (09/27 0539) Last BM Date: 02/17/12  Intake/Output from previous day: 09/26 0701 - 09/27 0700 In: 960 [P.O.:960] Out: -  Intake/Output this shift: Total I/O In: 350 [Blood:350] Out: -   General appearance: alert and no distress GI: soft, non-tender; bowel sounds normal; no masses,  no organomegaly and Rectal prominent hemorrhoidal tissue.  Lab Results:   [facility] 02/20/12 0506 02/19/12 0548  WBC 8.3 7.4  HGB 7.7* 7.6*  HCT 26.8* 26.2*  PLT 286 339   BMET  Basename 02/20/12 0506 02/19/12 0548  NA 140 138  K 4.3 4.0  CL 107 105  CO2 25 25  GLUCOSE 96 109*  BUN 11 13  CREATININE 0.79 0.86  CALCIUM 9.5 9.6   PT/INR  Basename 02/17/12 2126  LABPROT 14.4  INR 1.14   ABG No results found for this basename: PHART:2,PCO2:2,PO2:2,HCO3:2 in the last 72 hours  Studies/Results: No results found.  Anti-infectives: Anti-infectives     Start     Dose/Rate Route Frequency Ordered Stop   02/19/12 2203   cefOXitin (MEFOXIN) 2 g in dextrose 5 % 50 mL IVPB        2 g 100 mL/hr over 30 Minutes Intravenous 60 min pre-op 02/19/12 2203     02/18/12 1100   clarithromycin (BIAXIN) tablet 500 mg        500 mg Oral Every 12 hours 02/18/12 0958     02/18/12 1100   metroNIDAZOLE (FLAGYL) tablet 500 mg        500 mg Oral Every 12 hours 02/18/12 0958            Assessment/Plan: s/p Procedure(s) (LRB) with comments: HEMORRHOIDECTOMY (N/A) Chronic anemia suspected etiology is chronic internal prolapse hemorrhoidal bleeding. Again risks benefits alternatives excision were discussed. Patient will be  consented and taken to the operating room for the planned hemorrhoidectomy.  LOS: 3 days    Deran Barro C 02/20/2012

## 2012-02-20 NOTE — Anesthesia Preprocedure Evaluation (Signed)
Anesthesia Evaluation  Patient identified by MRN, date of birth, ID band Patient awake    Reviewed: Allergy & Precautions, H&P , NPO status , Patient's Chart, lab work & pertinent test results  History of Anesthesia Complications Negative for: history of anesthetic complications  Airway Mallampati: I TM Distance: >3 FB     Dental  (+) Teeth Intact   Pulmonary asthma (childhood only) ,  breath sounds clear to auscultation        Cardiovascular negative cardio ROS  Rhythm:Regular     Neuro/Psych    GI/Hepatic GERD-  Medicated and Controlled,  Endo/Other    Renal/GU      Musculoskeletal   Abdominal   Peds  Hematology  (+) Blood dyscrasia (chronic blood loss), anemia ,   Anesthesia Other Findings   Reproductive/Obstetrics                           Anesthesia Physical Anesthesia Plan  ASA: II  Anesthesia Plan: General   Post-op Pain Management:    Induction: Intravenous, Rapid sequence and Cricoid pressure planned  Airway Management Planned: Oral ETT  Additional Equipment:   Intra-op Plan:   Post-operative Plan: Extubation in OR  Informed Consent: I have reviewed the patients History and Physical, chart, labs and discussed the procedure including the risks, benefits and alternatives for the proposed anesthesia with the patient or authorized representative who has indicated his/her understanding and acceptance.     Plan Discussed with:   Anesthesia Plan Comments:         Anesthesia Quick Evaluation

## 2012-02-21 ENCOUNTER — Encounter (HOSPITAL_COMMUNITY): Payer: Self-pay | Admitting: Internal Medicine

## 2012-02-21 LAB — TYPE AND SCREEN
Unit division: 0
Unit division: 0

## 2012-02-21 MED ORDER — POLYETHYLENE GLYCOL 3350 17 GM/SCOOP PO POWD: 17.0000 g | Freq: Every day | ORAL | Status: AC | PRN

## 2012-02-21 MED ORDER — METRONIDAZOLE 500 MG PO TABS: 500.0000 mg | ORAL_TABLET | Freq: Two times a day (BID) | ORAL | Status: AC

## 2012-02-21 MED ORDER — FERROUS SULFATE 325 (65 FE) MG PO TABS: 325.0000 mg | ORAL_TABLET | Freq: Three times a day (TID) | ORAL | Status: DC

## 2012-02-21 MED ORDER — PANTOPRAZOLE SODIUM 40 MG PO TBEC: 40.0000 mg | DELAYED_RELEASE_TABLET | Freq: Two times a day (BID) | ORAL | Status: AC

## 2012-02-21 MED ORDER — FOLIC ACID 1 MG PO TABS: 1.0000 mg | ORAL_TABLET | Freq: Every day | ORAL | Status: AC

## 2012-02-21 MED ORDER — HYDROCODONE-ACETAMINOPHEN 5-325 MG PO TABS: 1.0000 | ORAL_TABLET | ORAL | Status: AC | PRN

## 2012-02-21 MED ORDER — SENNOSIDES-DOCUSATE SODIUM 8.6-50 MG PO TABS: 1.0000 | ORAL_TABLET | Freq: Two times a day (BID) | ORAL | Status: AC

## 2012-02-21 MED ORDER — HYDROCORTISONE ACETATE 25 MG RE SUPP: 25.0000 mg | Freq: Two times a day (BID) | RECTAL | Status: DC | PRN

## 2012-02-21 MED ORDER — CLARITHROMYCIN 500 MG PO TABS: 500.0000 mg | ORAL_TABLET | Freq: Two times a day (BID) | ORAL | Status: AC

## 2012-02-21 NOTE — Addendum Note (Signed)
Addendum  created 02/21/12 1158 by Despina Hidden, CRNA   Modules edited:Notes Section

## 2012-02-21 NOTE — Progress Notes (Signed)
1 Day Post-Op  Subjective: Overall doing very well. Pain controlled well.  Objective: Vital signs in last 24 hours: Temp:  [97.5 F (36.4 C)-98.4 F (36.9 C)] 98.4 F (36.9 C) (09/28 0156) Pulse Rate:  [52-76] 76  (09/28 0156) Resp:  [14-18] 16  (09/28 0156) BP: (106-174)/(69-97) 106/69 mmHg (09/28 0156) SpO2:  [92 %-100 %] 98 % (09/28 0156) Weight:  [109.453 kg (241 lb 4.8 oz)] 109.453 kg (241 lb 4.8 oz) (09/28 0156) Last BM Date: 02/20/12  Intake/Output from previous day: 09/27 0701 - 09/28 0700 In: 1600 [I.V.:1250; Blood:350] Out: -  Intake/Output this shift: Total I/O In: 120 [P.O.:120] Out: -   General appearance: alert and no distress GI: soft, non-tender; bowel sounds normal; no masses,  no organomegaly  Lab Results:   Basename 02/20/12 1315 02/20/12 0506 02/19/12 0548  WBC -- 8.3 7.4  HGB 8.6* 7.7* --  HCT 29.1* 26.8* --  PLT -- 286 339   BMET  Basename 02/20/12 0506 02/19/12 0548  NA 140 138  K 4.3 4.0  CL 107 105  CO2 25 25  GLUCOSE 96 109*  BUN 11 13  CREATININE 0.79 0.86  CALCIUM 9.5 9.6   PT/INR No results found for this basename: LABPROT:2,INR:2 in the last 72 hours ABG No results found for this basename: PHART:2,PCO2:2,PO2:2,HCO3:2 in the last 72 hours  Studies/Results: No results found.  Anti-infectives: Anti-infectives     Start     Dose/Rate Route Frequency Ordered Stop   02/21/12 0000   clarithromycin (BIAXIN) 500 MG tablet        500 mg Oral Every 12 hours 02/21/12 1138     02/21/12 0000   metroNIDAZOLE (FLAGYL) 500 MG tablet        500 mg Oral Every 12 hours 02/21/12 1138     02/19/12 2203   cefOXitin (MEFOXIN) 2 g in dextrose 5 % 50 mL IVPB        2 g 100 mL/hr over 30 Minutes Intravenous 60 min pre-op 02/19/12 2203 02/20/12 1412   02/18/12 1100   clarithromycin (BIAXIN) tablet 500 mg        500 mg Oral Every 12 hours 02/18/12 0958     02/18/12 1100   metroNIDAZOLE (FLAGYL) tablet 500 mg        500 mg Oral Every 12 hours  02/18/12 0958            Assessment/Plan: s/p Procedure(s) (LRB) with comments: HEMORRHOIDECTOMY (N/A) Doing well. Continue sitz baths. Continue local wound care. Continue stool softener. Continue high-fiber diet. Continue drinking plenty of water. Patient, office with any questions or concerns or problems. I plan to see the patient back in 2-3 weeks.  LOS: 4 days    Freemon Binford C 02/21/2012

## 2012-02-21 NOTE — Discharge Summary (Signed)
Physician Discharge Summary  Traci [name] ZOX:[id number] DOB: 06-26-1965 DOA: 02/17/2012  PCP: Provider Not In System [facility] Department pending  Admit date: 02/17/2012 Discharge date: 02/21/2012  Recommendations for Outpatient Follow-up:  1. She will followup at the [facility] Department as scheduled next week. She was also referred to the [facility] free clinic. She will followup with Dr. Leticia Penna in 2-3 weeks.  Discharge Diagnoses:  1. Hematochezia secondary to internal hemorrhoids. hemorrhoids. (Prolapse stage IV complicated internal hemorrhoids, status post hemorrhoidectomy by Dr. Dian Situ on 02/20/2012.) 2. 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 Status post a total of 4 units of packed red blood cell transfusions. Status post IV iron. The patient's hemoglobin was 4.4 on admission and 8.6 at the time of discharge. Her ferritin was less than 1. 3. History of H. pylori gastritis in November 2012, not treated secondary to cost. 4. Fatigue secondary to severe anemia. Resolved. 5. Obesity.   Discharge Condition: Improved.  Diet recommendation: Regular, high fiber.  Filed Weights   02/19/12 0530 02/20/12 0539 02/21/12 0156  Weight: 111.585 kg (246 lb) 111.812 kg (246 lb 8 oz) 109.453 kg (241 lb 4.8 oz)    History of present illness:  The patient is a 46 year old woman with a history significant for hemorrhoids, blood loss anemia, and H. pylori gastritis. She was admitted to the emergency department on 02/19/2012 with a chief complaint of progressive fatigue for 2 months. In the emergency department, she was hemodynamically stable and afebrile. Her lab data were significant for hemoglobin of 4.4, hematocrit of 16.9, and MCV of 54. She was admitted for further evaluation and management.  Hospital Course:  The patient was typed and crossed packed red blood cells. She was transfused 3 units during the first 24 hours. She was given Lasix  between the units. Her hemoglobin improved to 7.4. Proton pump inhibitor therapy was started empirically. She gave a history of hemorrhoidal bleeding, at times, rather profusely. On admission, she denied any bright red blood per rectum for more than one week. She had been using leftover over-the-counter Anusol. Because of financial constraints and no insurance, she could not afford H. pylori treatment last year when she underwent an EGD for evaluation. Because she had never been treated, Biaxin, metronidazole, and Protonix were started. The patient has a penicillin allergy. In addition to packed red blood cell transfusions, she was given 1 dose of intravenous iron when her ferritin was noted to be less than 1. Subsequently, she was started on ferrous sulfate 3 times daily. Anusol-HC was started each bedtime. Laxative therapy was given with twice a day dosing of Senokot S. Gastroenterologist, Dr. Karilyn Cota was consulted. Based on the previous workup, current evaluation, and his assessment, he believed that the patient's chronic iron deficiency anemia was due to to chronic hemorrhoidal bleeding. Therefore, instead of proceeding with an EGD or colonoscopy or even a capsule study, he recommended surgical intervention. He also advised the patient to discontinue Aleve and Advil or any other NSAIDs.  General surgeon, Dr. Leticia Penna was consulted. Following his assessment and discussion with the patient, he proceeded with a hemorrhoidectomy on 02/19/2012. During the operation, she was transfused one more unit of packed red blood cells. Following the hemorrhoidectomy, the patient was started on sitz baths and analgesics as needed.   The patient improved clinically and symptomatically. She remained hemodynamically stable and afebrile. She had no complaints of abdominal pain, nausea, or vomiting. There was no bright red blood per rectum at all  during the hospitalization. She had no complaints of fatigue at the time of discharge.  Her hemoglobin improved to 8.6. Dr. Leticia Penna provided the patient postoperative instructions for home. She was also instructed to take Senokot S. twice daily and MiraLax as needed. She was instructed on a diet that consisted of more fiber. She was also instructed to take ferrous sulfate 3 times a day, but no less than twice a day. The hospital assisted the patient with the completion of therapy for treatment of H. pylori gastritis. She had already made an appointment to followup with a healthcare provider at the health department. She was also given information on [place] free clinic since she works part-time.  Procedures:  Hemorrhoidectomy (2 columns) per Dr. Leticia Penna on 02/20/2012.  Consultations:  Gastroenterologist, Dr. Karilyn Cota  General surgeon, Dr. Leticia Penna  Discharge Exam: Filed Vitals:   02/20/12 1415 02/20/12 1445 02/20/12 2045 02/21/12 0156  BP: 136/72 124/72 126/83 106/69  Pulse: 54 54 68 76  Temp: 97.6 F (36.4 C) 97.5 F (36.4 C) 98.3 F (36.8 C) 98.4 F (36.9 C)  TempSrc:      Resp: 16 14 16 16   Height:      Weight:    109.453 kg (241 lb 4.8 oz)  SpO2: 94% 93% 100% 98%    General: Pleasant 46 year old African-American woman sitting up in bed, in no acute distress. Cardiovascular: S1, S2, with a soft systolic murmur. Respiratory: Clear to auscultation bilaterally. Abdomen: Obese, positive bowel sounds, soft, nontender, nondistended. Extremities: No pedal edema.  Discharge Instructions  Discharge Orders    Future Orders Please Complete By Expires   Diet general      Increase activity slowly      Discharge instructions      Comments:   Sitz baths as directed. Avoid constipation. Take all medications as prescribed.       Medication List     As of 02/21/2012  2:05 PM    STOP taking these medications         ACETAMINOPHEN EXTRA STRENGTH 500 MG tablet   Generic drug: acetaminophen      ALEVE 220 MG tablet   Generic drug: naproxen sodium      HEMORRHOIDAL  0.25-3-85.5 % suppository   Generic drug: shark liver oil-cocoa butter      TAKE these medications         B-12 PO   Take 1 tablet by mouth daily.      clarithromycin 500 MG tablet   Commonly known as: BIAXIN   Take 1 tablet (500 mg total) by mouth every 12 (twelve) hours. For treatment of H. pylori infection.      ferrous sulfate 325 (65 FE) MG tablet   Take 1 tablet (325 mg total) by mouth 3 (three) times daily with meals. Iron supplement.      folic acid 1 MG tablet   Commonly known as: FOLVITE   Take 1 tablet (1 mg total) by mouth daily. B. vitamin.      HYDROcodone-acetaminophen 5-325 MG per tablet   Commonly known as: NORCO/VICODIN   Take 1-2 tablets by mouth every 4 (four) hours as needed.      hydrocortisone 25 MG suppository   Commonly known as: ANUSOL-HC   Place 1 suppository (25 mg total) rectally 2 (two) times daily as needed for hemorrhoids.      metroNIDAZOLE 500 MG tablet   Commonly known as: FLAGYL   Take 1 tablet (500 mg total) by mouth every 12 (twelve)  hours. For treatment of H. pylori infection.      pantoprazole 40 MG tablet   Commonly known as: PROTONIX   Take 1 tablet (40 mg total) by mouth 2 (two) times daily before a meal. For treatment of H. pylori.      polyethylene glycol powder powder   Commonly known as: GLYCOLAX/MIRALAX   Take 17 g by mouth daily as needed (Constipation).      senna-docusate 8.6-50 MG per tablet   Commonly known as: Senokot-S   Take 1 tablet by mouth 2 (two) times daily.           Follow-up Information    Please follow up. (Followup at [facility] Department as already scheduled.)       Follow up with Fabio Bering, MD. Schedule an appointment as soon as possible for a visit in 1 week.   Contact information:   [address] [place] [address] [phone]           The results of significant diagnostics from this hospitalization (including imaging, microbiology, ancillary and  laboratory) are listed below for reference.    Significant Diagnostic Studies: No results found.  Microbiology: Recent Results (from the past 240 hour(s))  SURGICAL PCR SCREEN     Status: Normal   Collection Time   02/19/12  6:33 PM      Component Value Range Status Comment   MRSA, PCR NEGATIVE  NEGATIVE Final    Staphylococcus aureus NEGATIVE  NEGATIVE Final      Labs: Basic Metabolic Panel:  Lab 02/20/12 9147 02/19/12 0548 02/18/12 0916 02/17/12 1913  NA 140 138 138 136  K 4.3 4.0 3.9 3.9  CL 107 105 103 102  CO2 25 25 26 25   GLUCOSE 96 109* 110* 90  BUN 11 13 14 12   CREATININE 0.79 0.86 0.77 0.78  CALCIUM 9.5 9.6 9.6 9.3  MG -- -- -- --  PHOS -- -- -- --   Liver Function Tests: No results found for this basename: AST:5,ALT:5,ALKPHOS:5,BILITOT:5,PROT:5,ALBUMIN:5 in the last 168 hours No results found for this basename: LIPASE:5,AMYLASE:5 in the last 168 hours No results found for this basename: AMMONIA:5 in the last 168 hours CBC:  Lab 02/20/12 1315 02/20/12 0506 02/19/12 0548 02/18/12 0916 02/17/12 1913  WBC -- 8.3 7.4 6.1 7.6  NEUTROABS -- 6.1 -- -- 5.1  HGB 8.6* 7.7* 7.6* 7.4* 4.4*  HCT 29.1* 26.8* 26.2* 25.0* 16.9*  MCV -- 65.0* 64.1* 63.3* 54.0*  PLT -- 286 339 311 338   Cardiac Enzymes: No results found for this basename: CKTOTAL:5,CKMB:5,CKMBINDEX:5,TROPONINI:5 in the last 168 hours BNP: BNP (last 3 results) No results found for this basename: PROBNP:3 in the last 8760 hours CBG: No results found for this basename: GLUCAP:5 in the last 168 hours  Time coordinating discharge: Greater than 30 minutes  Signed:  Gladyce Mcray  [facility] 02/21/2012, 2:05 PM

## 2012-02-21 NOTE — Progress Notes (Signed)
Patient with orders to be discharge home. Discharge instructions given, patient verbalized understanding. Prescriptions given. Demonstrated Sitz bath instructions, patient did return demonstration. Patient in stable condition upon discharge. Patient left via private vehicle.

## 2012-02-21 NOTE — Anesthesia Postprocedure Evaluation (Signed)
  Anesthesia Post-op Note  Patient: Traci [name]  Procedure(s) Performed: Procedure(s) (LRB) with comments: HEMORRHOIDECTOMY (N/A)  Patient Location: room 311  Anesthesia Type: General  Level of Consciousness: awake, alert , oriented and patient cooperative  Airway and Oxygen Therapy: Patient Spontanous Breathing  Post-op Pain: none  Post-op Assessment: Post-op Vital signs reviewed, Patient's Cardiovascular Status Stable, Respiratory Function Stable, Patent Airway, No signs of Nausea or vomiting and Pain level controlled  Post-op Vital Signs: Reviewed and stable  Complications: No apparent anesthesia complications

## 2012-02-23 ENCOUNTER — Encounter: Payer: Self-pay | Admitting: Internal Medicine

## 2012-02-24 ENCOUNTER — Encounter (HOSPITAL_COMMUNITY): Payer: Self-pay | Admitting: General Surgery

## 2012-03-09 ENCOUNTER — Other Ambulatory Visit (HOSPITAL_COMMUNITY): Payer: Self-pay | Admitting: Nurse Practitioner

## 2012-03-09 DIAGNOSIS — Z139 Encounter for screening, unspecified: Secondary | ICD-10-CM

## 2012-03-23 ENCOUNTER — Encounter: Payer: Self-pay | Admitting: Internal Medicine

## 2012-03-23 ENCOUNTER — Ambulatory Visit (INDEPENDENT_AMBULATORY_CARE_PROVIDER_SITE_OTHER): Payer: Self-pay | Admitting: Internal Medicine

## 2012-03-23 VITALS — BP 130/67 | HR 77 | Temp 98.4°F | Ht 70.0 in | Wt 249.5 lb

## 2012-03-23 DIAGNOSIS — D649 Anemia, unspecified: Secondary | ICD-10-CM

## 2012-03-23 NOTE — Progress Notes (Signed)
Primary Care Physician:  Primary Gastroenterologist:  Dr. [name]  Pre-Procedure History & Physical: HPI:  Traci [name] is a 46 y.o. female here for followup of profound iron deficiency anemia secondary hemorrhoids and hematochezia. Dr. Leticia Penna performed hemorrhoidectomy. She also treated for H. pylori. She is doing great. She reports a little constipation for which she's been taking senna stool softener. She's had no further rectal bleeding last hemoglobin was in the 8 range the 27th of last month. She continues to take iron supplementation. Has no abdominal pain only rare occasional reflux symptoms. Prior EGD colonoscopy as outlined in the record.  Past Medical History  Diagnosis Date  . S/P colonoscopy 04/2007    Dr. Mann-hemorrhoids (int/ext)  . Asthma     as a child  . Iron deficiency anemia due to chronic blood loss 02/17/2012    Hb 4.4; secondary to hemorrhoidal bleeding.  [name] Helicobacter pylori gastritis (chronic gastritis) 11/20012    treated at [facility]  . Hemorrhoids, external 03/2011  . Hematochezia 02/2011 & 01/2012    Secondary to hemorrhoidal bleeding.  . IDA (iron deficiency anemia)   . Hiatal hernia     Past Surgical History  Procedure Date  . Right thumb   . Colonoscopy 2008    Dr. Loreta Ave, hemorrhoids  . Colonoscopy 03/2011    Inflammed external and anal hemorrhoids  . Esophagogastroduodenoscopy 19147    Gastric erosions. HP positive  . Hemorrhoid surgery 02/20/2012    Procedure: HEMORRHOIDECTOMY;  Surgeon: Fabio Bering, MD;  Location: [facility];  Service: General;  Laterality: N/A;    Prior to Admission medications   Medication Sig Start Date End Date Taking? Authorizing Provider  Cyanocobalamin (B-12 PO) Take 1 tablet by mouth daily.   Yes Historical Provider, MD  ferrous sulfate 325 (65 FE) MG tablet Take 1 tablet (325 mg total) by mouth 3 (three) times daily with meals. Iron supplement. 02/21/12  Yes Elliot Cousin, MD  folic acid (FOLVITE) 1 MG tablet Take 1 tablet  (1 mg total) by mouth daily. B. vitamin. 02/21/12  Yes Elliot Cousin, MD  polyethylene glycol powder (GLYCOLAX/MIRALAX) powder Take 17 g by mouth daily as needed (Constipation). 02/21/12  Yes Elliot Cousin, MD  senna-docusate (SENOKOT-S) 8.6-50 MG per tablet Take 1 tablet by mouth 2 (two) times daily. 02/21/12  Yes Elliot Cousin, MD  clarithromycin (BIAXIN) 500 MG tablet Take 1 tablet (500 mg total) by mouth every 12 (twelve) hours. For treatment of H. pylori infection. 02/21/12   Elliot Cousin, MD  HYDROcodone-acetaminophen (NORCO/VICODIN) 5-325 MG per tablet Take 1-2 tablets by mouth every 4 (four) hours as needed. 02/21/12   Elliot Cousin, MD  hydrocortisone (ANUSOL-HC) 25 MG suppository Place 1 suppository (25 mg total) rectally 2 (two) times daily as needed for hemorrhoids. 02/21/12   Elliot Cousin, MD  metroNIDAZOLE (FLAGYL) 500 MG tablet Take 1 tablet (500 mg total) by mouth every 12 (twelve) hours. For treatment of H. pylori infection. 02/21/12   Elliot Cousin, MD  pantoprazole (PROTONIX) 40 MG tablet Take 1 tablet (40 mg total) by mouth 2 (two) times daily before a meal. For treatment of H. pylori. 02/21/12   Elliot Cousin, MD    Allergies as of 03/23/2012 - Review Complete 03/23/2012  Allergen Reaction Noted  . Penicillins Nausea And Vomiting 02/14/2011    Family History  Problem Relation Age of Onset  . Cirrhosis Father   . Aneurysm Mother   . Colon cancer Neg Hx     History   Social  History  . Marital Status: Single    Spouse Name: N/A    Number of Children: 0  . Years of Education: N/A   Occupational History  . temp agency    Social History Main Topics  . Smoking status: Never Smoker   . Smokeless tobacco: Not on file  . Alcohol Use: No  . Drug Use: No  . Sexually Active: No   Other Topics Concern  . Not on file   Social History Narrative  . No narrative on file    Review of Systems: See HPI, otherwise negative ROS  Physical Exam: BP 130/67  Pulse 77  Temp  98.4 F (36.9 C) (Temporal)  Ht 5\' 10"  (1.778 m)  Wt 249 lb 8 oz (113.172 kg)  BMI 35.80 kg/m2 General:   Alert,  Well-developed, obese, well-nourished, pleasant and cooperative in NAD Skin:  Intact without significant lesions or rashes. Eyes:  Sclera clear, no icterus.   Conjunctiva pink. Ears:  Normal auditory acuity. Nose:  No deformity, discharge,  or lesions. Mouth:  No deformity or lesions. Neck:  Supple; no masses or thyromegaly. No significant cervical adenopathy. Lungs:  Clear throughout to auscultation.   No wheezes, crackles, or rhonchi. No acute distress. Heart:  Regular rate and rhythm; no murmurs, clicks, rubs,  or gallops. Abdomen: Obese. Non-distended, normal bowel sounds.  Soft and nontender without appreciable mass or hepatosplenomegaly.  Pulses:  Normal pulses noted. Extremities:  Without clubbing or edema.  Impression/Plan:  46 year old lady presented with an impressive iron deficiency anemia felt secondary hemorrhoidal bleeding. Bleeding has ceased. The question remains whether or not her hemoglobin will rebound nicely as it should if her iron deficiency anemia was due exclusively due to bleeding. It is very important to manage constipation successfully.  Recommendations: Keep appointment with Dr. Leticia Penna. Continue stool softener. Add Benefiber 2 tablespoons daily; may continue senna as needed. Continue iron for now. CBC today.  Further recommendations to follow.

## 2012-03-23 NOTE — Patient Instructions (Signed)
Constipation information  Continue stool softener  Continue Senna  As needed  CBC today  Benefiber - 2 tablespoons daily  Keep appt with Dr. Leticia Penna

## 2012-03-24 LAB — CBC WITH DIFFERENTIAL/PLATELET
Basophils Absolute: 0 10*3/uL (ref 0.0–0.1)
Eosinophils Relative: 2 % (ref 0–5)
HCT: 32.3 % — ABNORMAL LOW (ref 36.0–46.0)
Hemoglobin: 10 g/dL — ABNORMAL LOW (ref 12.0–15.0)
Lymphocytes Relative: 23 % (ref 12–46)
MCHC: 31 g/dL (ref 30.0–36.0)
Monocytes Absolute: 0.3 10*3/uL (ref 0.1–1.0)
Monocytes Relative: 4 % (ref 3–12)
Neutro Abs: 5.3 10*3/uL (ref 1.7–7.7)
RBC: 4.55 MIL/uL (ref 3.87–5.11)

## 2012-03-29 ENCOUNTER — Other Ambulatory Visit: Payer: Self-pay

## 2012-03-29 ENCOUNTER — Other Ambulatory Visit: Payer: Self-pay | Admitting: Internal Medicine

## 2012-03-29 DIAGNOSIS — A048 Other specified bacterial intestinal infections: Secondary | ICD-10-CM

## 2012-03-29 DIAGNOSIS — D649 Anemia, unspecified: Secondary | ICD-10-CM

## 2012-03-29 MED ORDER — HYDROCORTISONE ACETATE 25 MG RE SUPP: 25.0000 mg | Freq: Two times a day (BID) | RECTAL | Status: AC | PRN

## 2012-03-29 NOTE — Telephone Encounter (Signed)
Pt stated she was still having the burning and pain and some bleeding at times. She is going back to see the surgeon this month and is requesting refill because it helped a lot.

## 2012-03-29 NOTE — Telephone Encounter (Signed)
Completed.

## 2012-03-29 NOTE — Addendum Note (Signed)
Addended by: Nira Retort on: 03/29/2012 03:34 PM   Modules accepted: Orders

## 2012-03-29 NOTE — Telephone Encounter (Signed)
Is pt having problems? Requesting anusol.

## 2012-04-06 ENCOUNTER — Encounter: Payer: Self-pay | Admitting: Gastroenterology

## 2012-04-06 ENCOUNTER — Other Ambulatory Visit: Payer: Self-pay

## 2012-04-06 DIAGNOSIS — D649 Anemia, unspecified: Secondary | ICD-10-CM

## 2012-04-06 DIAGNOSIS — A048 Other specified bacterial intestinal infections: Secondary | ICD-10-CM

## 2012-05-04 ENCOUNTER — Encounter: Payer: Self-pay | Admitting: Gastroenterology

## 2012-05-04 ENCOUNTER — Ambulatory Visit (INDEPENDENT_AMBULATORY_CARE_PROVIDER_SITE_OTHER): Payer: Self-pay | Admitting: Gastroenterology

## 2012-05-04 VITALS — Temp 98.5°F | Ht 70.0 in | Wt 250.6 lb

## 2012-05-04 DIAGNOSIS — K2970 Gastritis, unspecified, without bleeding: Secondary | ICD-10-CM

## 2012-05-04 DIAGNOSIS — A048 Other specified bacterial intestinal infections: Secondary | ICD-10-CM

## 2012-05-04 DIAGNOSIS — D649 Anemia, unspecified: Secondary | ICD-10-CM

## 2012-05-04 DIAGNOSIS — K5900 Constipation, unspecified: Secondary | ICD-10-CM

## 2012-05-04 NOTE — Patient Instructions (Addendum)
Start taking Amitiza one capsule with food twice a day. This is for constipation. We have provided samples.   Please complete stool test and return to the lab as soon as possible.  You may wait and complete your blood tests in January.   Have a wonderful Christmas! Please call us to let us know how the Amitiza works for you. We can send a prescription in if this works well. Continue taking iron daily.

## 2012-05-04 NOTE — Progress Notes (Signed)
Primary Care Physician: None Primary GI: Dr. [name]   Chief Complaint  Patient presents with  . Follow-up  . Constipation    HPI:   46 year old female who presents in follow-up secondary to profound IDA; etiology r/t hemorrhoids and hematochezia. Dr. Leticia Penna has performed a hemorrhoidectomy. Last saw Dr. Leticia Penna right before Thanksgiving.  Colonoscopy Nov 2012 with inflamed external and anal hemorrhoids; EGD with H.pylori gastritis s/p treatment. Most recent CBC in Oct 2013 still with impressive microcytic anemia, Hgb 10.   Seen by Dr. [name] in Oct 2013, requested H.pylori stool antigen and updated CBC. Will add ferritin to this if not already done. Pt notes constipation, on iron pills. Taking every other day. Going back to taking stool softeners. Taking Benefiber, trying to change eating habits. No blood in stool. No abdominal pain. No NSAIDs or aspirin powders. Can't eat tomatoes. Bothers her. Doesn't take a PPI daily. Sometimes has intermittent heartburn/indigestion.   Past Medical History  Diagnosis Date  . S/P colonoscopy 04/2007    Dr. Mann-hemorrhoids (int/ext)  . Asthma     as a child  . Iron deficiency anemia due to chronic blood loss 02/17/2012    Hb 4.4; secondary to hemorrhoidal bleeding.  [name] Helicobacter pylori gastritis (chronic gastritis) 11/20012    treated at [facility]  . Hemorrhoids, external 03/2011  . Hematochezia 02/2011 & 01/2012    Secondary to hemorrhoidal bleeding.  . IDA (iron deficiency anemia)   . Hiatal hernia     Past Surgical History  Procedure Date  . Right thumb   . Colonoscopy 2008    Dr. Loreta Ave, hemorrhoids  . Colonoscopy 03/2011    Inflammed external and anal hemorrhoids  . Esophagogastroduodenoscopy 16109    Gastric erosions. HP positive  . Hemorrhoid surgery 02/20/2012    Procedure: HEMORRHOIDECTOMY;  Surgeon: Fabio Bering, MD;  Location: [facility];  Service: General;  Laterality: N/A;    Current Outpatient Prescriptions  Medication Sig Dispense  Refill  . clarithromycin (BIAXIN) 500 MG tablet Take 1 tablet (500 mg total) by mouth every 12 (twelve) hours. For treatment of H. pylori infection.  14 tablet  0  . Cyanocobalamin (B-12 PO) Take 1 tablet by mouth daily.      . ferrous sulfate 325 (65 FE) MG tablet Take 1 tablet (325 mg total) by mouth 3 (three) times daily with meals. Iron supplement.      . folic acid (FOLVITE) 1 MG tablet Take 1 tablet (1 mg total) by mouth daily. B. vitamin.      [name] HYDROcodone-acetaminophen (NORCO/VICODIN) 5-325 MG per tablet Take 1-2 tablets by mouth every 4 (four) hours as needed.  30 tablet  1  . pantoprazole (PROTONIX) 40 MG tablet Take 1 tablet (40 mg total) by mouth 2 (two) times daily before a meal. For treatment of H. pylori.  14 tablet  0  . polyethylene glycol powder (GLYCOLAX/MIRALAX) powder Take 17 g by mouth daily as needed (Constipation).  255 g  0  . senna-docusate (SENOKOT-S) 8.6-50 MG per tablet Take 1 tablet by mouth 2 (two) times daily.      . hydrocortisone (ANUSOL-HC) 25 MG suppository Place 1 suppository (25 mg total) rectally 2 (two) times daily as needed for hemorrhoids.  14 suppository  1  . metroNIDAZOLE (FLAGYL) 500 MG tablet Take 1 tablet (500 mg total) by mouth every 12 (twelve) hours. For treatment of H. pylori infection.  14 tablet  0    Allergies as of 05/04/2012 - Review Complete 05/04/2012  Allergen Reaction Noted  . Penicillins Nausea And Vomiting 02/14/2011    Family History  Problem Relation Age of Onset  . Cirrhosis Father   . Aneurysm Mother   . Colon cancer Neg Hx     History   Social History  . Marital Status: Single    Spouse Name: N/A    Number of Children: 0  . Years of Education: N/A   Occupational History  . temp agency    Social History Main Topics  . Smoking status: Never Smoker   . Smokeless tobacco: None  . Alcohol Use: No  . Drug Use: No  . Sexually Active: No   Other Topics Concern  . None   Social History Narrative  . None     Review of Systems: Negative unless otherwise mentioned above  Physical Exam: Temp 98.5 F (36.9 C) (Oral)  Ht 5\' 10"  (1.778 m)  Wt 250 lb 9.6 oz (113.671 kg)  BMI 35.96 kg/m2 General:   Alert and oriented. No distress noted. Pleasant and cooperative.  Head:  Normocephalic and atraumatic. Eyes:  Conjuctiva clear without scleral icterus. Mouth:  Oral mucosa pink and moist. Good dentition. No lesions. Heart:  S1, S2 present without murmurs, rubs, or gallops. Regular rate and rhythm. Abdomen:  +BS, soft, non-tender and non-distended. No rebound or guarding. No HSM or masses noted. Msk:  Symmetrical without gross deformities. Normal posture. Extremities:  Without edema. Neurologic:  Alert and  oriented x4;  grossly normal neurologically. Skin:  Intact without significant lesions or rashes. Psych:  Alert and cooperative. Normal mood and affect.

## 2012-05-06 ENCOUNTER — Ambulatory Visit (HOSPITAL_COMMUNITY)
Admission: RE | Admit: 2012-05-06 | Discharge: 2012-05-06 | Disposition: A | Discharge status: Home / Self Care | Payer: Self-pay | Source: Ambulatory Visit | Attending: Nurse Practitioner | Admitting: Nurse Practitioner

## 2012-05-06 DIAGNOSIS — Z139 Encounter for screening, unspecified: Secondary | ICD-10-CM

## 2012-05-06 NOTE — Progress Notes (Signed)
Per Lubertha Basque, pt has 100 % [facility] Assistance til May 2014.

## 2012-05-11 DIAGNOSIS — K5900 Constipation, unspecified: Secondary | ICD-10-CM | POA: Insufficient documentation

## 2012-05-11 DIAGNOSIS — B9681 Helicobacter pylori [H. pylori] as the cause of diseases classified elsewhere: Secondary | ICD-10-CM | POA: Insufficient documentation

## 2012-05-11 NOTE — Assessment & Plan Note (Signed)
Trial of Amitiza 

## 2012-05-11 NOTE — Assessment & Plan Note (Signed)
IDA, secondary to significant hemorrhoids, likely H.pylori component. Continued microcytic anemia. Repeat CBC and ferritin. Further recommendations to follow. Conisder small bowel evaluation if continued anemia.

## 2012-05-11 NOTE — Assessment & Plan Note (Signed)
46 year old female with recent H.pylori gastritis documented via EGD. Asymptomatic currently. Obtain stool antigen to document eradication.

## 2012-05-12 NOTE — Progress Notes (Signed)
No PCP on file 

## 2012-06-29 NOTE — Progress Notes (Signed)
Letter mailed to pt.  

## 2012-06-29 NOTE — Progress Notes (Signed)
Pt has not completed labs (CBC and ferritin) or H.pylori stool antigen. She had wanted to wait till Jan. Please send letter for reminder.

## 2012-07-19 LAB — CBC WITH DIFFERENTIAL/PLATELET
Basophils Relative: 0 % (ref 0–1)
Eosinophils Absolute: 0.1 10*3/uL (ref 0.0–0.7)
Eosinophils Relative: 2 % (ref 0–5)
HCT: 34.9 % — ABNORMAL LOW (ref 36.0–46.0)
Hemoglobin: 10.9 g/dL — ABNORMAL LOW (ref 12.0–15.0)
Lymphs Abs: 1.8 10*3/uL (ref 0.7–4.0)
MCH: 21.6 pg — ABNORMAL LOW (ref 26.0–34.0)
MCHC: 31.2 g/dL (ref 30.0–36.0)
MCV: 69.2 fL — ABNORMAL LOW (ref 78.0–100.0)
Monocytes Absolute: 0.3 10*3/uL (ref 0.1–1.0)
Monocytes Relative: 5 % (ref 3–12)
RBC: 5.04 MIL/uL (ref 3.87–5.11)

## 2012-07-20 LAB — HELICOBACTER PYLORI  SPECIAL ANTIGEN: H. PYLORI Antigen: NEGATIVE

## 2012-07-22 NOTE — Progress Notes (Signed)
Quick Note:  Pt did not turn in ifobt as requested. Please remind her this is important. Let's check TTG,IgA and IgA to assess for celiac disease.  If ifobt is positive, would recommend a capsule study.  H.pylori negative per stool antigen. ______

## 2012-07-23 NOTE — Progress Notes (Signed)
Quick Note:  Tried to call pt- LMOM ______ 

## 2012-07-28 ENCOUNTER — Other Ambulatory Visit: Payer: Self-pay | Admitting: Gastroenterology

## 2012-07-28 ENCOUNTER — Other Ambulatory Visit: Payer: Self-pay

## 2012-07-28 DIAGNOSIS — D649 Anemia, unspecified: Secondary | ICD-10-CM

## 2012-08-02 ENCOUNTER — Ambulatory Visit: Payer: Self-pay | Admitting: Gastroenterology

## 2012-08-06 NOTE — Progress Notes (Unsigned)
Pt never came to the office and picked up lab orders and ifobt. I have put lab order and and ifobt packet in the mail to the pt.

## 2012-08-16 ENCOUNTER — Ambulatory Visit: Payer: Self-pay | Admitting: Gastroenterology

## 2014-05-09 ENCOUNTER — Other Ambulatory Visit (HOSPITAL_COMMUNITY): Payer: Self-pay | Admitting: *Deleted

## 2014-05-09 DIAGNOSIS — Z1231 Encounter for screening mammogram for malignant neoplasm of breast: Secondary | ICD-10-CM

## 2014-05-15 ENCOUNTER — Ambulatory Visit (HOSPITAL_COMMUNITY)
Admission: RE | Admit: 2014-05-15 | Discharge: 2014-05-15 | Disposition: A | Discharge status: Home / Self Care | Payer: PRIVATE HEALTH INSURANCE | Source: Ambulatory Visit | Attending: *Deleted | Admitting: *Deleted

## 2014-05-15 DIAGNOSIS — Z1231 Encounter for screening mammogram for malignant neoplasm of breast: Secondary | ICD-10-CM | POA: Diagnosis not present

## 2016-05-27 ENCOUNTER — Other Ambulatory Visit (HOSPITAL_COMMUNITY): Payer: Self-pay | Admitting: *Deleted

## 2016-05-27 DIAGNOSIS — Z1231 Encounter for screening mammogram for malignant neoplasm of breast: Secondary | ICD-10-CM

## 2016-05-27 LAB — HM PAP SMEAR: HM Pap smear: NORMAL

## 2016-06-05 ENCOUNTER — Ambulatory Visit (HOSPITAL_COMMUNITY)
Admission: RE | Admit: 2016-06-05 | Discharge: 2016-06-05 | Disposition: A | Discharge status: Home / Self Care | Payer: PRIVATE HEALTH INSURANCE | Source: Ambulatory Visit | Attending: *Deleted | Admitting: *Deleted

## 2016-06-05 ENCOUNTER — Other Ambulatory Visit (HOSPITAL_COMMUNITY): Payer: Self-pay | Admitting: *Deleted

## 2016-06-05 ENCOUNTER — Ambulatory Visit (HOSPITAL_COMMUNITY): Payer: Self-pay

## 2016-06-05 DIAGNOSIS — Z1231 Encounter for screening mammogram for malignant neoplasm of breast: Secondary | ICD-10-CM | POA: Diagnosis present

## 2016-06-06 ENCOUNTER — Other Ambulatory Visit (HOSPITAL_COMMUNITY): Payer: Self-pay | Admitting: *Deleted

## 2016-06-06 ENCOUNTER — Ambulatory Visit (HOSPITAL_COMMUNITY)
Admission: RE | Admit: 2016-06-06 | Discharge: 2016-06-06 | Disposition: A | Discharge status: Home / Self Care | Payer: Self-pay | Source: Ambulatory Visit | Attending: *Deleted | Admitting: *Deleted

## 2016-06-06 DIAGNOSIS — R059 Cough, unspecified: Secondary | ICD-10-CM

## 2016-06-06 DIAGNOSIS — R05 Cough: Secondary | ICD-10-CM | POA: Insufficient documentation

## 2019-04-16 ENCOUNTER — Encounter (HOSPITAL_COMMUNITY): Payer: Self-pay | Admitting: Emergency Medicine

## 2019-04-16 ENCOUNTER — Other Ambulatory Visit: Payer: Self-pay

## 2019-04-16 ENCOUNTER — Emergency Department (HOSPITAL_COMMUNITY)
Admission: EM | Admit: 2019-04-16 | Discharge: 2019-04-17 | Disposition: A | Discharge status: Home / Self Care | Payer: Self-pay | Attending: Emergency Medicine | Admitting: Emergency Medicine

## 2019-04-16 DIAGNOSIS — Z79899 Other long term (current) drug therapy: Secondary | ICD-10-CM | POA: Insufficient documentation

## 2019-04-16 DIAGNOSIS — J45909 Unspecified asthma, uncomplicated: Secondary | ICD-10-CM | POA: Insufficient documentation

## 2019-04-16 DIAGNOSIS — R58 Hemorrhage, not elsewhere classified: Secondary | ICD-10-CM | POA: Insufficient documentation

## 2019-04-16 MED ORDER — POVIDONE-IODINE 5 % EX SOLN: Freq: Once | CUTANEOUS | Status: DC

## 2019-04-16 NOTE — Discharge Instructions (Signed)
Recommend maintaining the dressing until tomorrow.  If you have recurrence of bleeding at the site, recommend applying direct pressure.  If this does not stop with 10 to 15 minutes of direct pressure, recommend return to ER for reassessment.  Otherwise recommend follow-up with your primary doctor next week as needed.

## 2019-04-16 NOTE — ED Triage Notes (Signed)
Pt reports going to the bathroom and noting a vein on her right thigh burst and was bleeding. Pressure being held by pt at triage and not visualized.

## 2019-04-16 NOTE — ED Provider Notes (Signed)
[facility] EMERGENCY DEPARTMENT Provider Note   CSN: [id number] Arrival date & time: 04/16/19  1644     History   Chief Complaint Chief Complaint  Patient presents with  . Bleeding/Bruising    HPI Traci [name] is a 53 y.o. female.  Presents emerged department after bleeding noted in the bathroom.  Patient does not remember specific traumatic event, randomly noticed bleeding from her right thigh.  States attempted to stop bleeding with direct pressure but it kept bleeding so she came to the ER.  Denies any past history of bleeding disorder, no frequent bloody noses, no bleeding with prior surgeries.  No family history of bleeding disorder.  She has no other complaints at this time.     HPI  Past Medical History:  Diagnosis Date  . Asthma    as a child  . Helicobacter pylori gastritis (chronic gastritis) 11/20012   treated at [facility]  . Hematochezia 02/2011 & 01/2012   Secondary to hemorrhoidal bleeding.  [name] Hemorrhoids, external 03/2011  . Hiatal hernia   . IDA (iron deficiency anemia)   . Iron deficiency anemia due to chronic blood loss 02/17/2012   Hb 4.4; secondary to hemorrhoidal bleeding.  . S/P colonoscopy 04/2007   Dr. Eduard Clos (int/ext)    Patient Active Problem List   Diagnosis Date Noted  . Helicobacter pylori gastritis 05/11/2012  . Constipation 05/11/2012  . Hemorrhoids, external 02/18/2012  . Fatigue 02/18/2012  . Iron deficiency anemia due to chronic blood loss 02/17/2012  . Helicobacter pylori gastritis (chronic gastritis) 02/17/2012  . Hematochezia 03/07/2011  . Constipation 03/07/2011  . Anemia 02/14/2011  . Obesity 02/14/2011  . Asthma 02/14/2011    Past Surgical History:  Procedure Laterality Date  . COLONOSCOPY  2008   Dr. Loreta Ave, hemorrhoids  . COLONOSCOPY  03/2011   Inflammed external and anal hemorrhoids  . ESOPHAGOGASTRODUODENOSCOPY  20012   Gastric erosions. HP positive  . HEMORRHOID SURGERY  02/20/2012   Procedure:  HEMORRHOIDECTOMY;  Surgeon: Fabio Bering, MD;  Location: [facility];  Service: General;  Laterality: N/A;  . right thumb       OB History   No obstetric history on file.      Home Medications    Prior to Admission medications   Medication Sig Start Date End Date Taking? Authorizing Provider  clarithromycin (BIAXIN) 500 MG tablet Take 1 tablet (500 mg total) by mouth every 12 (twelve) hours. For treatment of H. pylori infection. 02/21/12   Elliot Cousin, MD  Cyanocobalamin (B-12 PO) Take 1 tablet by mouth daily.    [provider]  ferrous sulfate 325 (65 FE) MG tablet Take 1 tablet (325 mg total) by mouth 3 (three) times daily with meals. Iron supplement. 02/21/12   Elliot Cousin, MD  folic acid (FOLVITE) 1 MG tablet Take 1 tablet (1 mg total) by mouth daily. B. vitamin. 02/21/12   Elliot Cousin, MD  HYDROcodone-acetaminophen (NORCO/VICODIN) 5-325 MG per tablet Take 1-2 tablets by mouth every 4 (four) hours as needed. 02/21/12   Elliot Cousin, MD  hydrocortisone (ANUSOL-HC) 25 MG suppository Place 1 suppository (25 mg total) rectally 2 (two) times daily as needed for hemorrhoids. 03/29/12   Gelene Mink, NP  metroNIDAZOLE (FLAGYL) 500 MG tablet Take 1 tablet (500 mg total) by mouth every 12 (twelve) hours. For treatment of H. pylori infection. 02/21/12   Elliot Cousin, MD  pantoprazole (PROTONIX) 40 MG tablet Take 1 tablet (40 mg total) by mouth 2 (two) times daily before  a meal. For treatment of H. pylori. 02/21/12   Rexene Alberts, MD  polyethylene glycol powder (GLYCOLAX/MIRALAX) powder Take 17 g by mouth daily as needed (Constipation). 02/21/12   Rexene Alberts, MD  senna-docusate (SENOKOT-S) 8.6-50 MG per tablet Take 1 tablet by mouth 2 (two) times daily. 02/21/12   Rexene Alberts, MD    Family History Family History  Problem Relation Age of Onset  . Cirrhosis Father   . Aneurysm Mother   . Colon cancer Neg Hx     Social History Social History   Tobacco Use  . Smoking  status: Never Smoker  Substance Use Topics  . Alcohol use: No  . Drug use: No     Allergies   Penicillins   Review of Systems Review of Systems  Constitutional: Negative for chills and fever.  HENT: Negative for ear pain and sore throat.   Eyes: Negative for pain and visual disturbance.  Respiratory: Negative for cough and shortness of breath.   Cardiovascular: Negative for chest pain and palpitations.  Gastrointestinal: Negative for abdominal pain and vomiting.  Genitourinary: Negative for dysuria and hematuria.  Musculoskeletal: Negative for arthralgias and back pain.  Skin: Negative for color change and rash.  Neurological: Negative for seizures and syncope.  All other systems reviewed and are negative.    Physical Exam Updated Vital Signs BP (!) 163/106   Pulse (!) 103   Temp 97.8 F (36.6 C) (Oral)   Resp 18   Ht 5\' 10"  (1.778 m)   Wt 132 kg   SpO2 100%   BMI 41.75 kg/m   Physical Exam Vitals signs and nursing note reviewed.  Constitutional:      General: She is not in acute distress.    Appearance: She is well-developed.  HENT:     Head: Normocephalic and atraumatic.  Eyes:     Conjunctiva/sclera: Conjunctivae normal.  Neck:     Musculoskeletal: Neck supple.  Cardiovascular:     Rate and Rhythm: Normal rate and regular rhythm.     Heart sounds: No murmur.  Pulmonary:     Effort: Pulmonary effort is normal. No respiratory distress.     Breath sounds: Normal breath sounds.  Abdominal:     Palpations: Abdomen is soft.     Tenderness: There is no abdominal tenderness.  Musculoskeletal:     Comments: RLE: no trauma noted, frequent varicose veins noted, no TTP, no active bleeding, noted small dried blood over right lateral thigh, normal distal pulses  Skin:    General: Skin is warm and dry.  Neurological:     Mental Status: She is alert.      ED Treatments / Results  Labs (all labs ordered are listed, but only abnormal results are displayed)  Labs Reviewed - No data to display  EKG None  Radiology No results found.  Procedures Procedures (including critical care time)  Medications Ordered in ED Medications - No data to display   Initial Impression / Assessment and Plan / ED Course  I have reviewed the triage vital signs and the nursing notes.  Pertinent labs & imaging results that were available during my care of the patient were reviewed by me and considered in my medical decision making (see chart for details).        53 year old lady presents to ER after bleeding from a superficial vein on her right upper leg.  Bleeding stopped with direct pressure.  While in ER initially she had a small recurrence of the bleeding,  but again stopped with direct pressure.  Applied quick clot, 4 x 4's over site of bleed, then gentle Coban over her leg.  Recommended patient remove dressing tomorrow morning for inspection.  Reviewed return precautions, will discharge home.  After the discussed management above, the patient was determined to be safe for discharge.  The patient was in agreement with this plan and all questions regarding their care were answered.  ED return precautions were discussed and the patient will return to the ED with any significant worsening of condition.   Final Clinical Impressions(s) / ED Diagnoses   Final diagnoses:  Bleeding    ED Discharge Orders    None       Milagros Lollykstra, Juvenal Umar S, MD 04/16/19 2324

## 2019-08-06 ENCOUNTER — Ambulatory Visit: Payer: Self-pay | Attending: Internal Medicine

## 2019-08-06 DIAGNOSIS — Z23 Encounter for immunization: Secondary | ICD-10-CM

## 2019-08-06 NOTE — Progress Notes (Signed)
   Covid-19 Vaccination Clinic  Name:  Traci [name]    MRN: [id number] DOB: 1965-10-02  08/06/2019  [name] was observed post Covid-19 immunization for 15 minutes without incident. She was provided with Vaccine Information Sheet and instruction to access the V-Safe system.   [name] was instructed to call 911 with any severe reactions post vaccine: [name] Difficulty breathing  . Swelling of face and throat  . A fast heartbeat  . A bad rash all over body  . Dizziness and weakness   Immunizations Administered    Name Date Dose VIS Date Route   Pfizer COVID-19 Vaccine 08/06/2019  4:34 PM 0.3 mL 05/06/2019 Intramuscular   Manufacturer: [employer], [employer]   Lot: [id number]   NDC: [address]-4

## 2019-08-30 ENCOUNTER — Ambulatory Visit: Payer: Self-pay | Attending: Internal Medicine

## 2019-08-30 DIAGNOSIS — Z23 Encounter for immunization: Secondary | ICD-10-CM

## 2019-08-30 NOTE — Progress Notes (Signed)
   Covid-19 Vaccination Clinic  Name:  Traci [name]    MRN: [id number] DOB: [birth year]  08/30/2019  [name] was observed post Covid-19 immunization for 15 minutes without incident. She was provided with Vaccine Information Sheet and instruction to access the V-Safe system.   [name] was instructed to call 911 with any severe reactions post vaccine: [name] Difficulty breathing  . Swelling of face and throat  . A fast heartbeat  . A bad rash all over body  . Dizziness and weakness   Immunizations Administered    Name Date Dose VIS Date Route   Pfizer COVID-19 Vaccine 08/30/2019  3:06 PM 0.3 mL 05/06/2019 Intramuscular   Manufacturer: [employer], [employer]   Lot: [id number]   NDC: [address]-7

## 2020-03-10 ENCOUNTER — Ambulatory Visit: Payer: Self-pay

## 2020-03-24 ENCOUNTER — Ambulatory Visit: Payer: Self-pay | Attending: Internal Medicine

## 2020-03-24 DIAGNOSIS — Z23 Encounter for immunization: Secondary | ICD-10-CM

## 2021-10-28 ENCOUNTER — Ambulatory Visit: Payer: Self-pay | Admitting: Family Medicine

## 2021-10-29 ENCOUNTER — Ambulatory Visit: Payer: Self-pay | Admitting: Family Medicine

## 2021-11-04 ENCOUNTER — Encounter: Payer: Self-pay | Admitting: Family Medicine

## 2021-11-04 ENCOUNTER — Ambulatory Visit: Payer: 59 | Admitting: Family Medicine

## 2021-11-04 VITALS — BP 152/94 | HR 88 | Ht 68.0 in | Wt 292.0 lb

## 2021-11-04 DIAGNOSIS — R7301 Impaired fasting glucose: Secondary | ICD-10-CM

## 2021-11-04 DIAGNOSIS — E559 Vitamin D deficiency, unspecified: Secondary | ICD-10-CM

## 2021-11-04 DIAGNOSIS — Z1211 Encounter for screening for malignant neoplasm of colon: Secondary | ICD-10-CM

## 2021-11-04 DIAGNOSIS — Z1231 Encounter for screening mammogram for malignant neoplasm of breast: Secondary | ICD-10-CM

## 2021-11-04 DIAGNOSIS — Z23 Encounter for immunization: Secondary | ICD-10-CM

## 2021-11-04 DIAGNOSIS — R030 Elevated blood-pressure reading, without diagnosis of hypertension: Secondary | ICD-10-CM | POA: Diagnosis not present

## 2021-11-04 DIAGNOSIS — Z1159 Encounter for screening for other viral diseases: Secondary | ICD-10-CM | POA: Diagnosis not present

## 2021-11-04 DIAGNOSIS — Z0001 Encounter for general adult medical examination with abnormal findings: Secondary | ICD-10-CM

## 2021-11-04 DIAGNOSIS — Z114 Encounter for screening for human immunodeficiency virus [HIV]: Secondary | ICD-10-CM

## 2021-11-04 NOTE — Assessment & Plan Note (Signed)
Unstable Elevated BP today 152/94 Noted to have white coat syndrome Advise pt to check BP at home daily and bring ambulatory reading at his next visit. Encouraged to implement lifestyle modifications such as exercising and a heart-healthy diet Encouraged exercising 3 times a week for 30 mins Dash eating plan reviewed Reports making changes and implementing lifestyle changes F/u in 1 month

## 2021-11-04 NOTE — Progress Notes (Signed)
New Patient Office Visit  Subjective:  Patient ID: Traci [name], female    DOB: 04/11/1966  Age: 56 y.o. MRN: [id number]  CC:  Chief Complaint  Patient presents with   New Patient (Initial Visit)    Pt will establish care, would like to have blood work, has seasonal allergies that have been flaring up.     HPI Traci [name] is a 56 y.o. female with past medical history of external hemorrhoirds presents for establishing care.  Elevated BP: reports having white coat syndrome. She noted to be implementing lifestyle changes which include exercising and healthy eating. No headaches, dizziness, or blurred vision were reported. She has lost 3 lbs.    Past Medical History:  Diagnosis Date   Asthma    as a child   Helicobacter pylori gastritis (chronic gastritis) 11/20012   treated at [facility]   Hematochezia 02/2011 & 01/2012   Secondary to hemorrhoidal bleeding.   Hemorrhoids, external 03/2011   Hiatal hernia    IDA (iron deficiency anemia)    Iron deficiency anemia due to chronic blood loss 02/17/2012   Hb 4.4; secondary to hemorrhoidal bleeding.   S/P colonoscopy 04/2007   Dr. Venda Rodes (int/ext)    Past Surgical History:  Procedure Laterality Date   COLONOSCOPY  2008   Dr. Collene Mares, hemorrhoids   COLONOSCOPY  03/2011   Inflammed external and anal hemorrhoids   ESOPHAGOGASTRODUODENOSCOPY  20012   Gastric erosions. HP positive   HEMORRHOID SURGERY  02/20/2012   Procedure: HEMORRHOIDECTOMY;  Surgeon: Donato Heinz, MD;  Location: [facility];  Service: General;  Laterality: N/A;   right thumb      Family History  Problem Relation Age of Onset   Cirrhosis Father    Aneurysm Mother    Colon cancer Neg Hx     Social History   Socioeconomic History   Marital status: Single    Spouse name: Not on file   Number of children: 0   Years of education: Not on file   Highest education level: Not on file  Occupational History   Occupation: [employer]  Tobacco Use    Smoking status: Never   Smokeless tobacco: Not on file  Substance and Sexual Activity   Alcohol use: No   Drug use: No   Sexual activity: Never    Birth control/protection: None  Other Topics Concern   Not on file  Social History Narrative   Not on file   Social Determinants of Health   Financial Resource Strain: Not on file  Food Insecurity: Not on file  Transportation Needs: Not on file  Physical Activity: Not on file  Stress: Not on file  Social Connections: Not on file  Intimate Partner Violence: Not on file    ROS Review of Systems  Constitutional:  Negative for chills, fatigue and fever.  HENT:  Positive for rhinorrhea (takes zytrec). Negative for congestion, sinus pressure, sinus pain and sneezing.   Eyes:  Negative for pain, redness and visual disturbance.  Respiratory:  Negative for chest tightness and shortness of breath.   Cardiovascular:  Negative for chest pain and palpitations.  Gastrointestinal:  Negative for constipation, diarrhea, nausea and vomiting.  Endocrine: Negative for polydipsia, polyphagia and polyuria.  Genitourinary:  Negative for dyspareunia, frequency and urgency.  Musculoskeletal:  Negative for back pain and neck pain.  Skin:  Negative for rash and wound.  Neurological:  Negative for dizziness, tremors, weakness and numbness.  Psychiatric/Behavioral:  Negative for confusion, sleep disturbance and  suicidal ideas.     Objective:   Today's Vitals: BP (!) 152/94 (BP Location: Left Arm)   Pulse 88   Ht '5\' 8"'  (1.727 m)   Wt 292 lb (132.5 kg)   SpO2 98%   BMI 44.40 kg/m   Physical Exam HENT:     Head: Normocephalic.     Right Ear: External ear normal.     Left Ear: External ear normal.     Nose: No congestion.     Mouth/Throat:     Mouth: Mucous membranes are moist.  Eyes:     Extraocular Movements: Extraocular movements intact.     Pupils: Pupils are equal, round, and reactive to light.  Cardiovascular:     Rate and Rhythm: Normal  rate and regular rhythm.     Pulses: Normal pulses.     Heart sounds: Normal heart sounds.  Pulmonary:     Effort: Pulmonary effort is normal.     Breath sounds: Normal breath sounds.  Abdominal:     Palpations: Abdomen is soft.  Musculoskeletal:     Cervical back: No rigidity.     Right lower leg: No edema.     Left lower leg: No edema.  Skin:    Findings: No lesion.  Neurological:     Mental Status: She is alert and oriented to person, place, and time.  Psychiatric:     Comments: Normal affect     Assessment & Plan:   Problem List Items Addressed This Visit       Other   Elevated BP without diagnosis of hypertension - Primary    Unstable Elevated BP today 152/94 Noted to have white coat syndrome Advise pt to check BP at home daily and bring ambulatory reading at his next visit. Encouraged to implement lifestyle modifications such as exercising and a heart-healthy diet Encouraged exercising 3 times a week for 30 mins Dash eating plan reviewed Reports making changes and implementing lifestyle changes F/u in 1 month       Other Visit Diagnoses     Colon cancer screening       Relevant Orders   Cologuard   Breast cancer screening by mammogram       Relevant Orders   MM 3D SCREEN BREAST BILATERAL   Need for hepatitis C screening test       Relevant Orders   Hepatitis C Antibody   Encounter for screening for HIV       Relevant Orders   HIV antibody (with reflex)   Vitamin D deficiency       Relevant Orders   Vitamin D (25 hydroxy)   IFG (impaired fasting glucose)       Relevant Orders   Hemoglobin A1C   Encounter for general adult medical examination with abnormal findings       Relevant Orders   CBC with Differential/Platelet   CMP14+EGFR   Lipid panel   TSH + free T4   Need for Tdap vaccination       Relevant Orders   Tdap vaccine greater than or equal to 7yo IM (Completed)   Need for shingles vaccine       Relevant Orders   Varicella-zoster  vaccine IM (Shingrix) (Completed)       Outpatient Encounter Medications as of 11/04/2021  Medication Sig   clarithromycin (BIAXIN) 500 MG tablet Take 1 tablet (500 mg total) by mouth every 12 (twelve) hours. For treatment of H. pylori infection. (Patient not taking: Reported on 11/04/2021)  Cyanocobalamin (B-12 PO) Take 1 tablet by mouth daily. (Patient not taking: Reported on 11/04/2021)   ferrous sulfate 325 (65 FE) MG tablet Take 1 tablet (325 mg total) by mouth 3 (three) times daily with meals. Iron supplement. (Patient not taking: Reported on 5/95/6387)   folic acid (FOLVITE) 1 MG tablet Take 1 tablet (1 mg total) by mouth daily. B. vitamin. (Patient not taking: Reported on 11/04/2021)   HYDROcodone-acetaminophen (NORCO/VICODIN) 5-325 MG per tablet Take 1-2 tablets by mouth every 4 (four) hours as needed. (Patient not taking: Reported on 11/04/2021)   hydrocortisone (ANUSOL-HC) 25 MG suppository Place 1 suppository (25 mg total) rectally 2 (two) times daily as needed for hemorrhoids. (Patient not taking: Reported on 11/04/2021)   metroNIDAZOLE (FLAGYL) 500 MG tablet Take 1 tablet (500 mg total) by mouth every 12 (twelve) hours. For treatment of H. pylori infection. (Patient not taking: Reported on 11/04/2021)   pantoprazole (PROTONIX) 40 MG tablet Take 1 tablet (40 mg total) by mouth 2 (two) times daily before a meal. For treatment of H. pylori. (Patient not taking: Reported on 11/04/2021)   polyethylene glycol powder (GLYCOLAX/MIRALAX) powder Take 17 g by mouth daily as needed (Constipation). (Patient not taking: Reported on 11/04/2021)   senna-docusate (SENOKOT-S) 8.6-50 MG per tablet Take 1 tablet by mouth 2 (two) times daily. (Patient not taking: Reported on 11/04/2021)   No facility-administered encounter medications on file as of 11/04/2021.    Follow-up: Return in about 1 month (around 12/04/2021).   Alvira Monday, FNP

## 2021-11-04 NOTE — Patient Instructions (Addendum)
I appreciate the opportunity to provide care to you today!    Follow up:  1 months  Labs: please stop by the lab tomorrow to get your blood drawn (CBC, CMP, TSH, Lipid profile, HgA1c, Vit D)  Screening: HIV and Hep C  Thank you for getting your Tdap and Shingles vaccine    Please continue to a heart-healthy diet and increase your physical activities. Try to exercise for at least three times a week.      It was a pleasure to see you and I look forward to continuing to work together on your health and well-being. Please do not hesitate to call the office if you need care or have questions about your care.   Have a wonderful day and week. With Gratitude, Gilmore Laroche MSN, FNP-BC

## 2021-11-06 ENCOUNTER — Other Ambulatory Visit: Payer: Self-pay | Admitting: Family Medicine

## 2021-11-06 DIAGNOSIS — D508 Other iron deficiency anemias: Secondary | ICD-10-CM

## 2021-11-06 DIAGNOSIS — E559 Vitamin D deficiency, unspecified: Secondary | ICD-10-CM

## 2021-11-06 LAB — LIPID PANEL
Chol/HDL Ratio: 3.1 ratio (ref 0.0–4.4)
Cholesterol, Total: 158 mg/dL (ref 100–199)
HDL: 51 mg/dL (ref >39)
LDL Chol Calc (NIH): 95 mg/dL (ref 0–99)
Triglycerides: 57 mg/dL (ref 0–149)
VLDL Cholesterol Cal: 12 mg/dL (ref 5–40)

## 2021-11-06 LAB — CMP14+EGFR
ALT: 12 IU/L (ref 0–32)
AST: 18 IU/L (ref 0–40)
Albumin/Globulin Ratio: 1.8 (ref 1.2–2.2)
Albumin: 4.4 g/dL (ref 3.8–4.9)
Alkaline Phosphatase: 72 IU/L (ref 44–121)
BUN/Creatinine Ratio: 10 (ref 9–23)
BUN: 8 mg/dL (ref 6–24)
Bilirubin Total: 0.6 mg/dL (ref 0.0–1.2)
CO2: 22 mmol/L (ref 20–29)
Calcium: 9 mg/dL (ref 8.7–10.2)
Chloride: 105 mmol/L (ref 96–106)
Creatinine, Ser: 0.82 mg/dL (ref 0.57–1.00)
Globulin, Total: 2.4 g/dL (ref 1.5–4.5)
Glucose: 99 mg/dL (ref 70–99)
Potassium: 4.1 mmol/L (ref 3.5–5.2)
Sodium: 142 mmol/L (ref 134–144)
Total Protein: 6.8 g/dL (ref 6.0–8.5)
eGFR: 83 mL/min/{1.73_m2} (ref >59)

## 2021-11-06 LAB — CBC WITH DIFFERENTIAL/PLATELET
Basophils Absolute: 0 10*3/uL (ref 0.0–0.2)
Basos: 0 %
EOS (ABSOLUTE): 0.1 10*3/uL (ref 0.0–0.4)
Eos: 1 %
Hematocrit: 29.3 % — ABNORMAL LOW (ref 34.0–46.6)
Hemoglobin: 8.6 g/dL — ABNORMAL LOW (ref 11.1–15.9)
Immature Grans (Abs): 0 10*3/uL (ref 0.0–0.1)
Immature Granulocytes: 0 %
Lymphocytes Absolute: 1.4 10*3/uL (ref 0.7–3.1)
Lymphs: 16 %
MCH: 19.2 pg — ABNORMAL LOW (ref 26.6–33.0)
MCHC: 29.4 g/dL — ABNORMAL LOW (ref 31.5–35.7)
MCV: 65 fL — ABNORMAL LOW (ref 79–97)
Monocytes Absolute: 0.3 10*3/uL (ref 0.1–0.9)
Monocytes: 4 %
Neutrophils Absolute: 6.6 10*3/uL (ref 1.4–7.0)
Neutrophils: 79 %
Platelets: 432 10*3/uL (ref 150–450)
RBC: 4.48 x10E6/uL (ref 3.77–5.28)
RDW: 18.1 % — ABNORMAL HIGH (ref 11.7–15.4)
WBC: 8.4 10*3/uL (ref 3.4–10.8)

## 2021-11-06 LAB — HEMOGLOBIN A1C
Est. average glucose Bld gHb Est-mCnc: 134 mg/dL
Hgb A1c MFr Bld: 6.3 % — ABNORMAL HIGH (ref 4.8–5.6)

## 2021-11-06 LAB — HEPATITIS C ANTIBODY: Hep C Virus Ab: NONREACTIVE

## 2021-11-06 LAB — VITAMIN D 25 HYDROXY (VIT D DEFICIENCY, FRACTURES): Vit D, 25-Hydroxy: 8.3 ng/mL — ABNORMAL LOW (ref 30.0–100.0)

## 2021-11-06 LAB — TSH+FREE T4
Free T4: 1.2 ng/dL (ref 0.82–1.77)
TSH: 2.73 u[IU]/mL (ref 0.450–4.500)

## 2021-11-06 LAB — HIV ANTIBODY (ROUTINE TESTING W REFLEX): HIV Screen 4th Generation wRfx: NONREACTIVE

## 2021-11-06 MED ORDER — VITAMIN D (ERGOCALCIFEROL) 1.25 MG (50000 UNIT) PO CAPS: 50000.0000 [IU] | ORAL_CAPSULE | ORAL | 1 refills | Status: DC

## 2021-11-06 MED ORDER — FERROUS SULFATE 325 (65 FE) MG PO TABS: 325.0000 mg | ORAL_TABLET | Freq: Three times a day (TID) | ORAL | 4 refills | Status: AC

## 2021-11-06 NOTE — Progress Notes (Signed)
Please inform the patient that her labs indicate that she has iron deficiency anemia and is deficient in Vit D. She also has prediabetes.   I refilled her iron supplement and sent a prescription for Vit.D once weekly supplement. She can pick up the medications at her pharmacy.   I want her to come back in 1 month to labs to assess her iron levels. The order is placed.   I recommend weight loss and a diet low in fat and calories for her prediabetes.

## 2021-11-07 ENCOUNTER — Telehealth: Payer: Self-pay | Admitting: Family Medicine

## 2021-11-07 ENCOUNTER — Ambulatory Visit (HOSPITAL_COMMUNITY)
Admission: RE | Admit: 2021-11-07 | Discharge: 2021-11-07 | Disposition: A | Discharge status: Home / Self Care | Payer: 59 | Source: Ambulatory Visit | Attending: Family Medicine | Admitting: Family Medicine

## 2021-11-07 ENCOUNTER — Encounter: Payer: Self-pay | Admitting: Family Medicine

## 2021-11-07 DIAGNOSIS — Z1231 Encounter for screening mammogram for malignant neoplasm of breast: Secondary | ICD-10-CM | POA: Insufficient documentation

## 2021-11-07 NOTE — Telephone Encounter (Signed)
Pt informed of labs

## 2021-11-07 NOTE — Progress Notes (Signed)
Pap documentation 

## 2021-11-07 NOTE — Telephone Encounter (Signed)
Pt called back for test results 

## 2021-11-07 NOTE — Telephone Encounter (Signed)
Returned call unable to reach pt.  

## 2021-11-18 ENCOUNTER — Telehealth: Payer: Self-pay

## 2021-11-18 ENCOUNTER — Other Ambulatory Visit: Payer: Self-pay | Admitting: Family Medicine

## 2021-11-18 DIAGNOSIS — B379 Candidiasis, unspecified: Secondary | ICD-10-CM

## 2021-11-18 MED ORDER — FLUCONAZOLE 150 MG PO TABS: 150.0000 mg | ORAL_TABLET | Freq: Once | ORAL | 0 refills | Status: AC

## 2021-11-18 NOTE — Telephone Encounter (Signed)
Please return patient call she forgot to ask about the pill that was discuss earlier over the phone with nurse

## 2021-11-18 NOTE — Telephone Encounter (Signed)
Left vm asking for a return call.

## 2021-11-19 NOTE — Telephone Encounter (Signed)
Left a vm for pt letting her know the correct strength on the vitamin d (5000 iu) daily.

## 2021-12-04 ENCOUNTER — Ambulatory Visit: Payer: 59 | Admitting: Family Medicine

## 2022-02-20 ENCOUNTER — Ambulatory Visit (INDEPENDENT_AMBULATORY_CARE_PROVIDER_SITE_OTHER): Payer: 59 | Admitting: Family Medicine

## 2022-02-20 ENCOUNTER — Encounter: Payer: Self-pay | Admitting: Family Medicine

## 2022-02-20 VITALS — BP 138/88 | HR 82 | Ht 70.0 in | Wt 280.0 lb

## 2022-02-20 DIAGNOSIS — J302 Other seasonal allergic rhinitis: Secondary | ICD-10-CM | POA: Diagnosis not present

## 2022-02-20 DIAGNOSIS — Z1211 Encounter for screening for malignant neoplasm of colon: Secondary | ICD-10-CM | POA: Diagnosis not present

## 2022-02-20 DIAGNOSIS — L723 Sebaceous cyst: Secondary | ICD-10-CM | POA: Diagnosis not present

## 2022-02-20 DIAGNOSIS — Z23 Encounter for immunization: Secondary | ICD-10-CM

## 2022-02-20 MED ORDER — LEVOCETIRIZINE DIHYDROCHLORIDE 5 MG PO TABS: 5.0000 mg | ORAL_TABLET | Freq: Every evening | ORAL | 0 refills | Status: DC

## 2022-02-20 NOTE — Assessment & Plan Note (Addendum)
Informed the patient that the cyst is benign  She reports that she would like the removal of the cyst as she does not like the appearance of the cyst on her neck Referral placed to dermatology for cyst removal

## 2022-02-20 NOTE — Progress Notes (Signed)
Acute Office Visit  Subjective:     Patient ID: Traci [name], female    DOB: 04-25-1966, 56 y.o.   MRN: [id number]  Chief Complaint  Patient presents with   Neck Pain    Cyst on left side of neck has pictures of a flare up over the weekend.  Will also be getting flu.     HPI The patient is in today complaining of a cyst on the left side of her neck.  She reports having flare-ups of the cyst over the weekend.  She notes that the cyst increased in size, with mild tenderness.  She denies erythema and drainage from the cyst.  She reports having the cyst for about 6 months with occasional flare-ups.  She applied warm compresses, which have been helping to reduce swelling.  She denies pain, decreased neck range of motion, and fever.  She denies upper respiratory infection, noting to have seasonal allergies.  Review of Systems  Constitutional:  Negative for chills and fever.  Respiratory:  Negative for cough and shortness of breath.   Cardiovascular:  Negative for chest pain and palpitations.  Skin:        Cyst on the left side of the neck  Neurological:  Negative for dizziness and headaches.   l    Objective:    BP 138/88 (BP Location: Left Arm)   Pulse 82   Ht 5\' 10"  (1.778 m)   Wt 280 lb 0.6 oz (127 kg)   SpO2 97%   BMI 40.18 kg/m    Physical Exam HENT:     Head: Normocephalic.  Cardiovascular:     Rate and Rhythm: Normal rate and regular rhythm.     Pulses: Normal pulses.     Heart sounds: Normal heart sounds.  Pulmonary:     Effort: Pulmonary effort is normal.     Breath sounds: Normal breath sounds.  Skin:    Findings: Lesion present.     Comments:  Hard, nontender lump under the skin  Neurological:     Mental Status: She is alert.     No results found for any visits on 02/20/22.      Assessment & Plan:   Problem List Items Addressed This Visit       Musculoskeletal and Integument   Sebaceous cyst - Primary    Informed the patient that the cyst  is benign  She reports that she would like the removal of the cyst as she does not like the appearance of the cyst on her neck Referral placed to dermatology for cyst removal      Relevant Orders   Ambulatory referral to Dermatology     Other   Need for immunization against influenza    Patient educated on CDC recommendation for the vaccine. Verbal consent was obtained from the patient, vaccine administered by nurse, no sign of adverse reactions noted at this time. Patient education on arm soreness and use of tylenol or ibuprofen for this patient  was discussed. Patient educated on the signs and symptoms of adverse effect and advise to contact the office if they occur.      Other Visit Diagnoses     Flu vaccine need       Relevant Orders   Flu Vaccine QUAD 6+ mos PF IM (Fluarix Quad PF) (Completed)   Immunization due       Relevant Orders   Varicella-zoster vaccine IM (Completed)   Colon cancer screening  Relevant Orders   Ambulatory referral to Gastroenterology   Seasonal allergic rhinitis, unspecified trigger       Relevant Medications   levocetirizine (XYZAL) 5 MG tablet       Meds ordered this encounter  Medications   DISCONTD: levocetirizine (XYZAL) 5 MG tablet    Sig: Take 1 tablet (5 mg total) by mouth every evening.    Dispense:  30 tablet    Refill:  0   levocetirizine (XYZAL) 5 MG tablet    Sig: Take 1 tablet (5 mg total) by mouth every evening.    Dispense:  30 tablet    Refill:  0    Return in about 3 months (around 05/22/2022).  Gilmore Laroche, FNP

## 2022-02-20 NOTE — Patient Instructions (Addendum)
I appreciate the opportunity to provide care to you today!    Follow up:  3 months   Referrals today- dermatology for cyst removal  and GI for colonoscopy     Please continue to a heart-healthy diet and increase your physical activities. Try to exercise for 32mins at least three times a week.      It was a pleasure to see you and I look forward to continuing to work together on your health and well-being. Please do not hesitate to call the office if you need care or have questions about your care.   Have a wonderful day and week. With Gratitude, Alvira Monday MSN, FNP-BC

## 2022-02-20 NOTE — Assessment & Plan Note (Signed)
Patient educated on CDC recommendation for the vaccine. Verbal consent was obtained from the patient, vaccine administered by nurse, no sign of adverse reactions noted at this time. Patient education on arm soreness and use of tylenol or ibuprofen for this patient  was discussed. Patient educated on the signs and symptoms of adverse effect and advise to contact the office if they occur.  

## 2022-02-25 ENCOUNTER — Telehealth: Payer: Self-pay

## 2022-02-25 ENCOUNTER — Other Ambulatory Visit: Payer: Self-pay

## 2022-02-25 DIAGNOSIS — Z1211 Encounter for screening for malignant neoplasm of colon: Secondary | ICD-10-CM

## 2022-02-25 MED ORDER — NA SULFATE-K SULFATE-MG SULF 17.5-3.13-1.6 GM/177ML PO SOLN: 1.0000 | Freq: Once | ORAL | 0 refills | Status: AC

## 2022-02-25 NOTE — Telephone Encounter (Signed)
Gastroenterology Pre-Procedure Review  Request Date: 10/31 Requesting Physician: Dr. Vicente Males  PATIENT REVIEW QUESTIONS: The patient responded to the following health history questions as indicated:    1. Are you having any GI issues?  Hemorrhoids, blood in stool occasionally 2. Do you have a personal history of Polyps? no 3. Do you have a family history of Colon Cancer or Polyps? no 4. Diabetes Mellitus? no 5. Joint replacements in the past 12 months?no 6. Major health problems in the past 3 months?no 7. Any artificial heart valves, MVP, or defibrillator?no    MEDICATIONS & ALLERGIES:    Patient reports the following regarding taking any anticoagulation/antiplatelet therapy:   Plavix, Coumadin, Eliquis, Xarelto, Lovenox, Pradaxa, Brilinta, or Effient? no Aspirin? no  Patient confirms/reports the following medications:  Current Outpatient Medications  Medication Sig Dispense Refill   clarithromycin (BIAXIN) 500 MG tablet Take 1 tablet (500 mg total) by mouth every 12 (twelve) hours. For treatment of H. pylori infection. 14 tablet 0   Cyanocobalamin (B-12 PO) Take 1 tablet by mouth daily.     ferrous sulfate 325 (65 FE) MG tablet Take 1 tablet (325 mg total) by mouth in the morning, at noon, and at bedtime. take each dose 1 hour before meals 60 tablet 4   folic acid (FOLVITE) 1 MG tablet Take 1 tablet (1 mg total) by mouth daily. B. vitamin.     HYDROcodone-acetaminophen (NORCO/VICODIN) 5-325 MG per tablet Take 1-2 tablets by mouth every 4 (four) hours as needed. 30 tablet 1   hydrocortisone (ANUSOL-HC) 25 MG suppository Place 1 suppository (25 mg total) rectally 2 (two) times daily as needed for hemorrhoids. 14 suppository 1   levocetirizine (XYZAL) 5 MG tablet Take 1 tablet (5 mg total) by mouth every evening. 30 tablet 0   metroNIDAZOLE (FLAGYL) 500 MG tablet Take 1 tablet (500 mg total) by mouth every 12 (twelve) hours. For treatment of H. pylori infection. 14 tablet 0   pantoprazole  (PROTONIX) 40 MG tablet Take 1 tablet (40 mg total) by mouth 2 (two) times daily before a meal. For treatment of H. pylori. 14 tablet 0   polyethylene glycol powder (GLYCOLAX/MIRALAX) powder Take 17 g by mouth daily as needed (Constipation). 255 g 0   senna-docusate (SENOKOT-S) 8.6-50 MG per tablet Take 1 tablet by mouth 2 (two) times daily.     No current facility-administered medications for this visit.    Patient confirms/reports the following allergies:  Allergies  Allergen Reactions   Penicillins Nausea And Vomiting    No orders of the defined types were placed in this encounter.   AUTHORIZATION INFORMATION Primary Insurance: 1D#: Group #:  Secondary Insurance: 1D#: Group #:  SCHEDULE INFORMATION: Date:03/25/22  Time: Location: [place]

## 2022-03-19 ENCOUNTER — Other Ambulatory Visit: Payer: Self-pay | Admitting: Family Medicine

## 2022-03-19 DIAGNOSIS — J302 Other seasonal allergic rhinitis: Secondary | ICD-10-CM

## 2022-03-24 ENCOUNTER — Telehealth: Payer: Self-pay

## 2022-03-24 ENCOUNTER — Telehealth: Payer: Self-pay | Admitting: Family Medicine

## 2022-03-24 DIAGNOSIS — Z1211 Encounter for screening for malignant neoplasm of colon: Secondary | ICD-10-CM

## 2022-03-24 NOTE — Telephone Encounter (Signed)
If wanting something else will need a visit with available provider

## 2022-03-24 NOTE — Telephone Encounter (Signed)
Pt called stating she was given medication for sinus/allergies and it is not helping. States she still have runny nose, drainage & coughing up fleem. Wants to know if she needs something stronger or just keep taking what she was given?    [facility]

## 2022-03-24 NOTE — Telephone Encounter (Signed)
Patients call has been returned her colonoscopy has been rescheduled from 03/25/22 with Dr. Vicente Males to 04/11/22.  Trish in Endo has been informed of date change.  Instructions updated.

## 2022-03-24 NOTE — Telephone Encounter (Signed)
Patient left a voicemail to reschedule a colonoscopy for 03/25/2022. She does not have a ride

## 2022-03-27 ENCOUNTER — Telehealth: Payer: Self-pay

## 2022-03-27 NOTE — Telephone Encounter (Signed)
Called patient back after leaving a voicemail wanting to reschedule her colonoscopy. She did not answer. Voicemail was left so she could call back and reschedule.

## 2022-04-10 ENCOUNTER — Telehealth: Payer: Self-pay

## 2022-04-10 NOTE — Telephone Encounter (Signed)
Patient was scheduled for her screening colonoscopy tomorrow but her procedure is now canceled.  The reason for her call this morning is because she is experience bleeding hemorrhoids.  She said it is like she is having a menstrual cycle from her rectum, with clots.    She has been advised to go to the ER to be evaluated.  She lives in [place] and will be going to [facility] in [place].  Thanks,  [place], [place]

## 2022-04-11 ENCOUNTER — Ambulatory Visit: Admission: RE | Admit: 2022-04-11 | Payer: 59 | Source: Home / Self Care | Admitting: Gastroenterology

## 2022-04-11 ENCOUNTER — Encounter: Admission: RE | Payer: Self-pay | Source: Home / Self Care

## 2022-04-11 SURGERY — COLONOSCOPY WITH PROPOFOL
Anesthesia: General

## 2022-04-15 ENCOUNTER — Other Ambulatory Visit: Payer: Self-pay | Admitting: Family Medicine

## 2022-04-15 DIAGNOSIS — J302 Other seasonal allergic rhinitis: Secondary | ICD-10-CM

## 2022-05-23 ENCOUNTER — Ambulatory Visit: Payer: 59 | Admitting: Family Medicine

## 2022-09-17 ENCOUNTER — Ambulatory Visit: Payer: 59 | Admitting: Family Medicine

## 2022-11-19 ENCOUNTER — Ambulatory Visit: Payer: 59 | Admitting: Dermatology

## 2022-12-23 IMAGING — MG MM DIGITAL SCREENING BILAT W/ TOMO AND CAD
6 of 12 series · 6 of 36 positions shown · non-contrast
Comparison: Previous exam(s).

CLINICAL DATA: Screening.

EXAM:
DIGITAL SCREENING BILATERAL MAMMOGRAM WITH TOMOSYNTHESIS AND CAD
TECHNIQUE: Bilateral screening digital craniocaudal and mediolateral oblique
mammograms were obtained. Bilateral screening digital breast
tomosynthesis was performed. The images were evaluated with
computer-aided detection.

[R CC synth-2D]
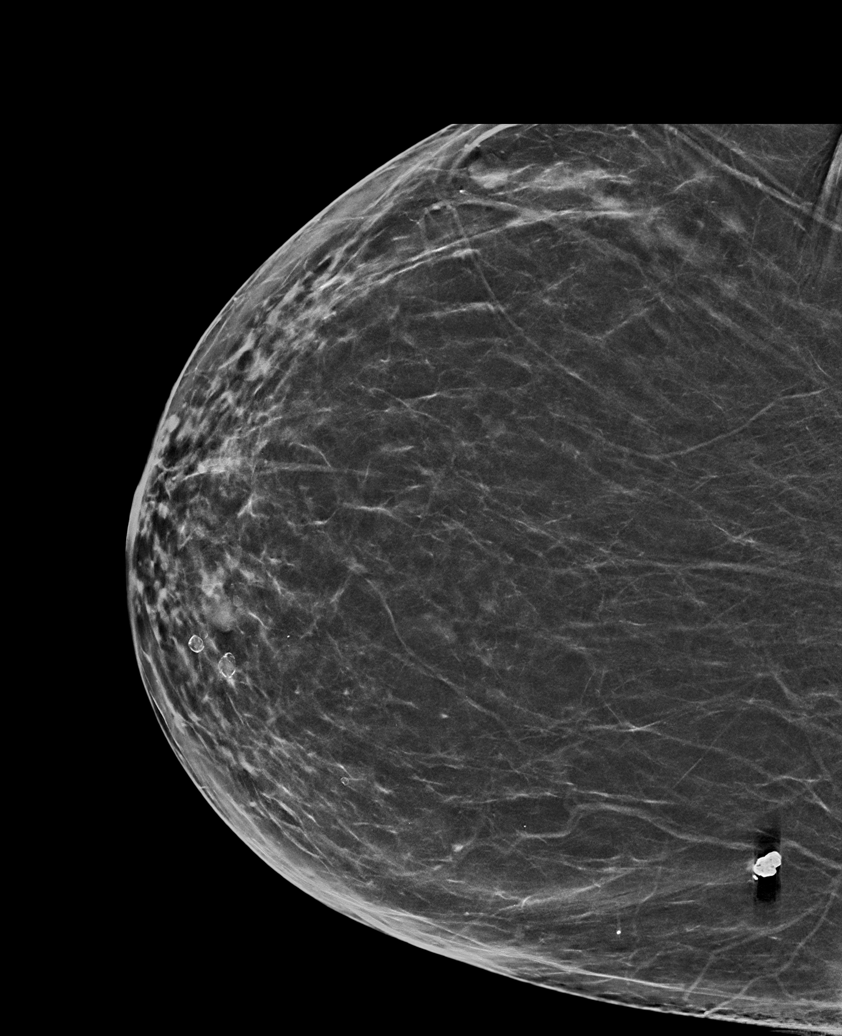

[R MLO synth-2D]
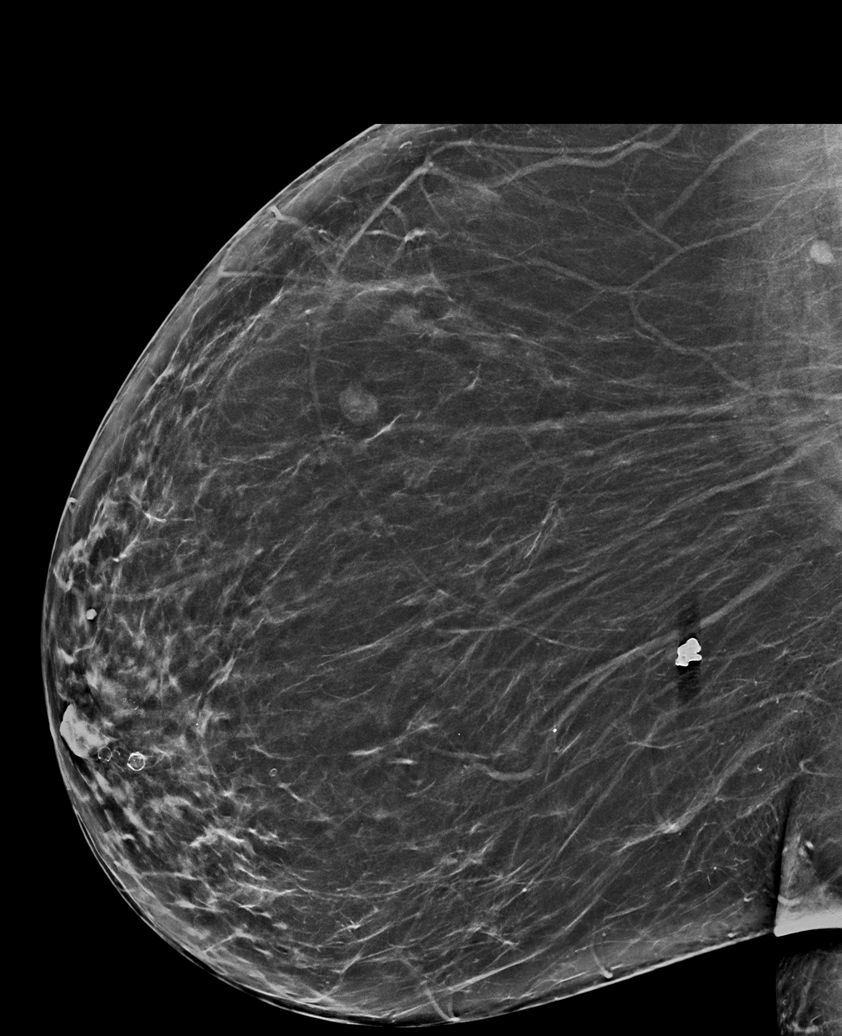

[L MLO synth-2D (1 of 2)]
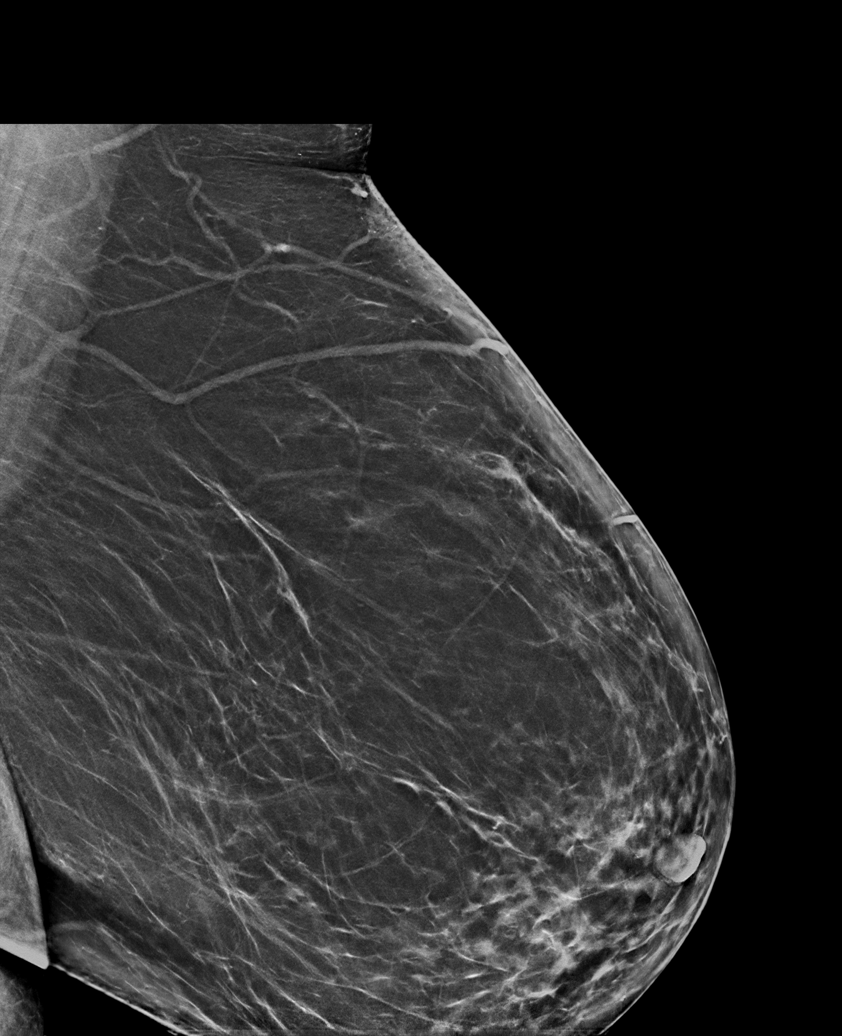

[L CC synth-2D (1 of 2)]
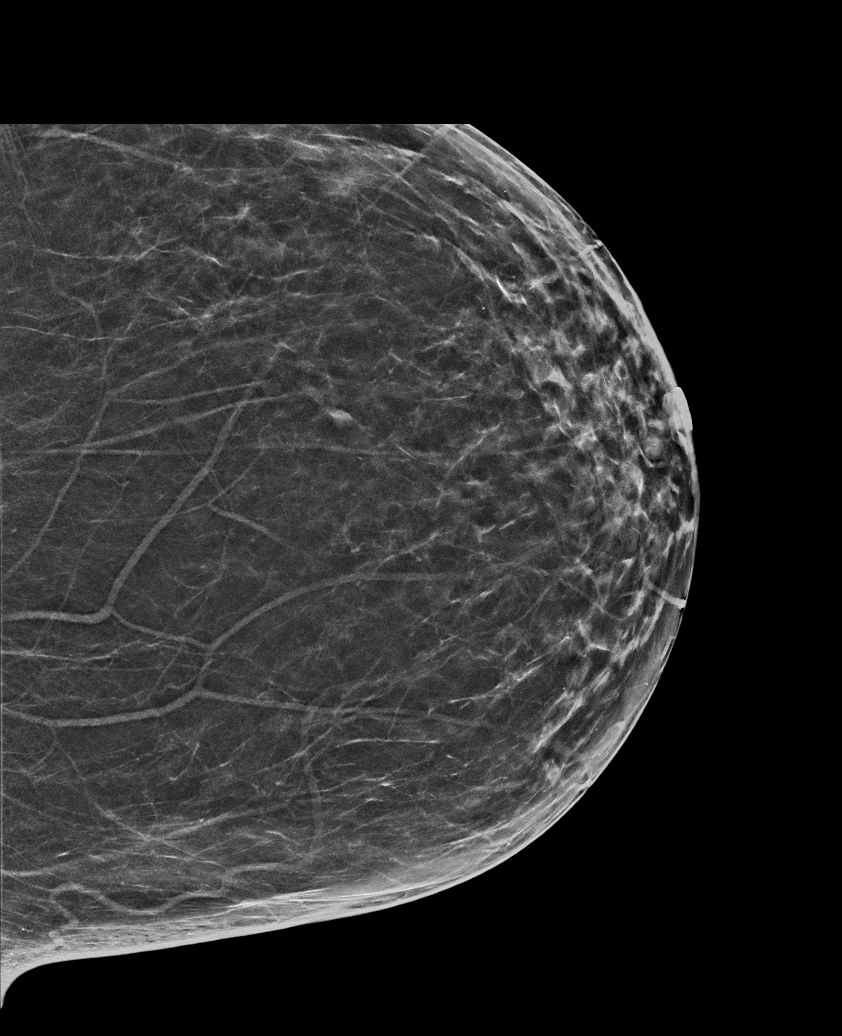

[L CC synth-2D (2 of 2)]
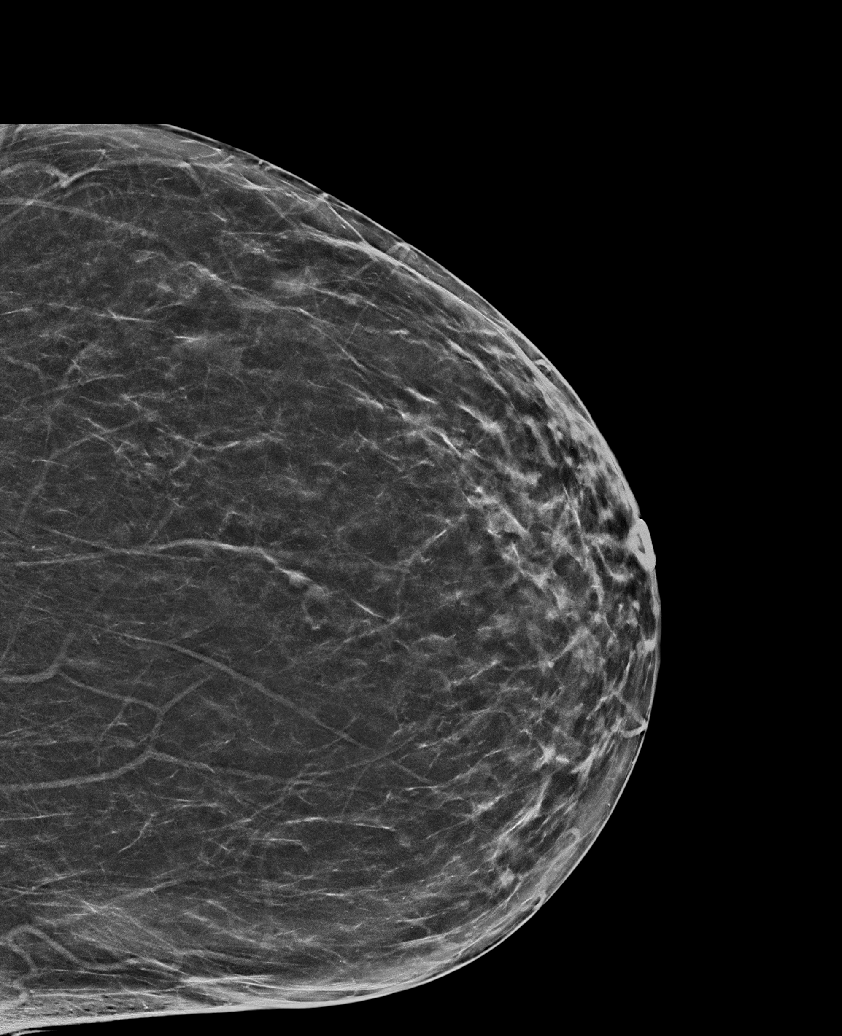

[L MLO synth-2D (2 of 2)]
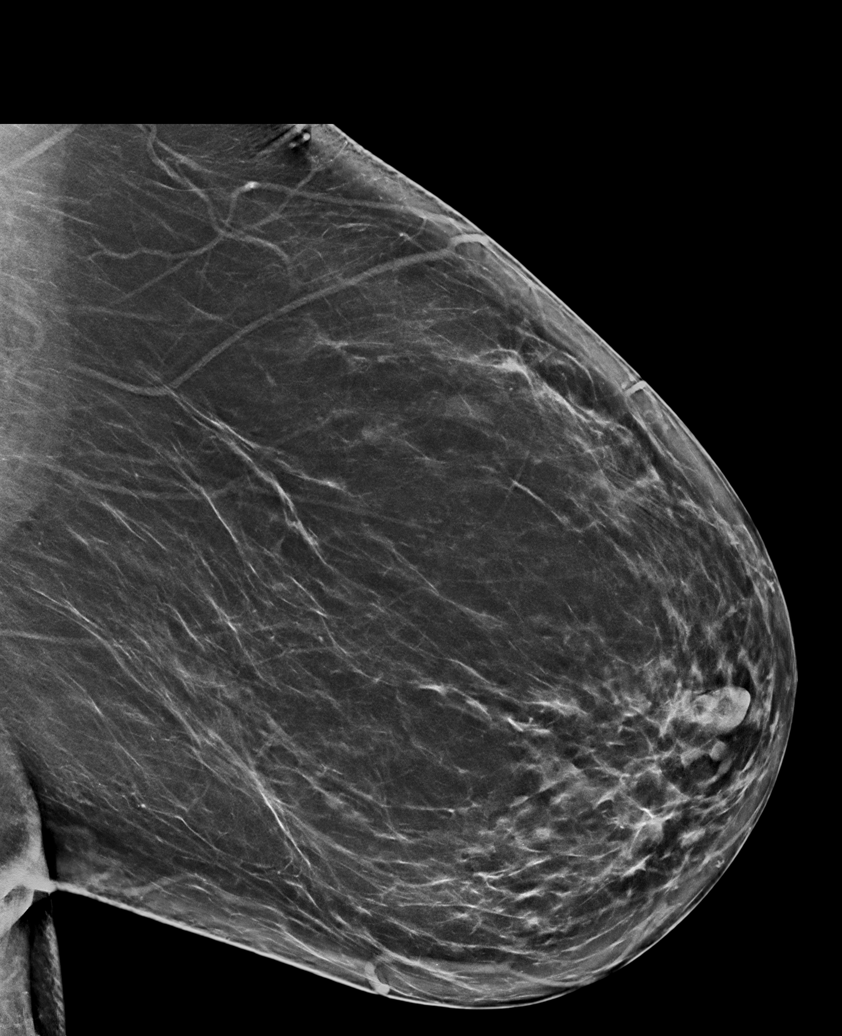

[6 of 36 positions shown; findings below may reference images not displayed]

ACR Breast Density Category b: There are scattered areas of
fibroglandular density.
FINDINGS: There are no findings suspicious for malignancy.
IMPRESSION: No mammographic evidence of malignancy. A result letter of this
screening mammogram will be mailed directly to the patient.

RECOMMENDATION:
Screening mammogram in one year. (Code:51-O-LD2)

BI-RADS CATEGORY  1: Negative.

## 2023-01-16 ENCOUNTER — Encounter (HOSPITAL_COMMUNITY): Payer: Self-pay | Admitting: Emergency Medicine

## 2023-01-16 ENCOUNTER — Other Ambulatory Visit: Payer: Self-pay

## 2023-01-16 ENCOUNTER — Emergency Department (HOSPITAL_COMMUNITY)
Admission: EM | Admit: 2023-01-16 | Discharge: 2023-01-16 | Disposition: A | Discharge status: Home / Self Care | Payer: 59 | Attending: Emergency Medicine | Admitting: Emergency Medicine

## 2023-01-16 DIAGNOSIS — S161XXA Strain of muscle, fascia and tendon at neck level, initial encounter: Secondary | ICD-10-CM | POA: Diagnosis not present

## 2023-01-16 DIAGNOSIS — S199XXA Unspecified injury of neck, initial encounter: Secondary | ICD-10-CM | POA: Diagnosis present

## 2023-01-16 DIAGNOSIS — X501XXA Overexertion from prolonged static or awkward postures, initial encounter: Secondary | ICD-10-CM | POA: Diagnosis not present

## 2023-01-16 MED ORDER — METHOCARBAMOL 500 MG PO TABS
500.0000 mg | ORAL_TABLET | Freq: Four times a day (QID) | ORAL | 0 refills | Status: AC | PRN
Start: 2023-01-16 — End: ?

## 2023-01-16 MED ORDER — NAPROXEN 500 MG PO TABS: 500.0000 mg | ORAL_TABLET | Freq: Two times a day (BID) | ORAL | 0 refills | Status: AC

## 2023-01-16 MED ORDER — METHOCARBAMOL 500 MG PO TABS
500.0000 mg | ORAL_TABLET | Freq: Once | ORAL | Status: AC
  Administered 2023-01-16: 500 mg via ORAL
  Filled 2023-01-16: qty 1

## 2023-01-16 MED ORDER — KETOROLAC TROMETHAMINE 10 MG PO TABS
10.0000 mg | ORAL_TABLET | Freq: Once | ORAL | Status: AC
  Administered 2023-01-16: 10 mg via ORAL
  Filled 2023-01-16: qty 1

## 2023-01-16 MED ORDER — LIDOCAINE 5 % EX PTCH
1.0000 | MEDICATED_PATCH | CUTANEOUS | Status: DC
  Administered 2023-01-16: 1 via TRANSDERMAL
  Filled 2023-01-16: qty 1

## 2023-01-16 NOTE — ED Triage Notes (Signed)
Pt with c/o "severe cramp" in her neck since yesterday.

## 2023-01-16 NOTE — ED Provider Notes (Cosign Needed Addendum)
[place] EMERGENCY DEPARTMENT AT [facility] Provider Note   CSN: [id number] Arrival date & time: 01/16/23  2028     History  Chief Complaint  Patient presents with   Neck Pain    Traci [name] is a 57 y.o. female.  She denies any significant past medical history.  She presents the ER today complaining of right posterior neck pain.  Started 2 days ago in the morning, states that she woke up and sat up quickly and thinks she "jerked" her neck and pulled a muscle, but having pain and tight sensation in the neck since then.  No chest pain or shortness of breath, pain is okay when she is sitting still but when she tries to turn her head it gets worse.  She tried muscle rub and heating pad without relief.  Denies any numbness or tingling, the pain does not radiate.  She had no trauma to her neck or head.   Neck Pain      Home Medications Prior to Admission medications   Medication Sig Start Date End Date Taking? Authorizing Provider  methocarbamol (ROBAXIN) 500 MG tablet Take 1 tablet (500 mg total) by mouth every 6 (six) hours as needed for muscle spasms. 01/16/23  Yes Sena Hoopingarner A, PA-C  naproxen (NAPROSYN) 500 MG tablet Take 1 tablet (500 mg total) by mouth 2 (two) times daily. 01/16/23  Yes Candas Deemer A, PA-C  clarithromycin (BIAXIN) 500 MG tablet Take 1 tablet (500 mg total) by mouth every 12 (twelve) hours. For treatment of H. pylori infection. 02/21/12   Elliot Cousin, MD  Cyanocobalamin (B-12 PO) Take 1 tablet by mouth daily.    [provider]  ferrous sulfate 325 (65 FE) MG tablet Take 1 tablet (325 mg total) by mouth in the morning, at noon, and at bedtime. take each dose 1 hour before meals 11/06/21   Gilmore Laroche, FNP  folic acid (FOLVITE) 1 MG tablet Take 1 tablet (1 mg total) by mouth daily. B. vitamin. 02/21/12   Elliot Cousin, MD  HYDROcodone-acetaminophen (NORCO/VICODIN) 5-325 MG per tablet Take 1-2 tablets by mouth every 4 (four) hours  as needed. 02/21/12   Elliot Cousin, MD  hydrocortisone (ANUSOL-HC) 25 MG suppository Place 1 suppository (25 mg total) rectally 2 (two) times daily as needed for hemorrhoids. 03/29/12   Gelene Mink, NP  levocetirizine (XYZAL) 5 MG tablet TAKE 1 TABLET BY MOUTH EVERY DAY IN THE EVENING 04/15/22   Gilmore Laroche, FNP  metroNIDAZOLE (FLAGYL) 500 MG tablet Take 1 tablet (500 mg total) by mouth every 12 (twelve) hours. For treatment of H. pylori infection. 02/21/12   Elliot Cousin, MD  pantoprazole (PROTONIX) 40 MG tablet Take 1 tablet (40 mg total) by mouth 2 (two) times daily before a meal. For treatment of H. pylori. 02/21/12   Elliot Cousin, MD  polyethylene glycol powder (GLYCOLAX/MIRALAX) powder Take 17 g by mouth daily as needed (Constipation). 02/21/12   Elliot Cousin, MD  senna-docusate (SENOKOT-S) 8.6-50 MG per tablet Take 1 tablet by mouth 2 (two) times daily. 02/21/12   Elliot Cousin, MD      Allergies    Penicillins    Review of Systems   Review of Systems  Musculoskeletal:  Positive for neck pain.    Physical Exam Updated Vital Signs BP (!) 163/92 (BP Location: Right Arm)   Pulse 80   Temp 99.7 F (37.6 C) (Oral)   Resp 18   Ht 5\' 10"  (1.778 m)   Wt  133.8 kg   SpO2 100%   BMI 42.33 kg/m  Physical Exam Vitals and nursing note reviewed.  Constitutional:      General: She is not in acute distress.    Appearance: She is well-developed.  HENT:     Head: Normocephalic and atraumatic.     Mouth/Throat:     Mouth: Mucous membranes are moist.  Eyes:     Conjunctiva/sclera: Conjunctivae normal.  Neck:     Comments: Tenderness to right paraspinous muscles, no midline bony tenderness, spasm palpated in area of tenderness as well. Cardiovascular:     Rate and Rhythm: Normal rate and regular rhythm.     Heart sounds: No murmur heard. Pulmonary:     Effort: Pulmonary effort is normal. No respiratory distress.     Breath sounds: Normal breath sounds.  Abdominal:      Palpations: Abdomen is soft.     Tenderness: There is no abdominal tenderness.  Musculoskeletal:        General: No swelling. Normal range of motion.     Cervical back: Neck supple.  Skin:    General: Skin is warm and dry.     Capillary Refill: Capillary refill takes less than 2 seconds.  Neurological:     General: No focal deficit present.     Mental Status: She is alert and oriented to person, place, and time.     Sensory: No sensory deficit.     Motor: No weakness.     Gait: Gait normal.  Psychiatric:        Mood and Affect: Mood normal.     ED Results / Procedures / Treatments   Labs (all labs ordered are listed, but only abnormal results are displayed) Labs Reviewed - No data to display  EKG None  Radiology No results found.  Procedures Procedures    Medications Ordered in ED Medications  methocarbamol (ROBAXIN) tablet 500 mg (has no administration in time range)  ketorolac (TORADOL) tablet 10 mg (has no administration in time range)  lidocaine (LIDODERM) 5 % 1 patch (has no administration in time range)    ED Course/ Medical Decision Making/ A&P                                 Medical Decision Making Ddx: Muscle strain, contusion, spasm, HNP, other ED course: Patient presents with right posterior neck pain, tender in the right paraspinous muscles, worse with rotation to either side.  Does not radiate, there are no chest pain, no exertional pain, this is not consistent with an anginal equivalent.  Pain very reproducible on palpation and spasm is noted on exam.  Will give methocarbamol and naproxen to help with pain, advised on follow-up and return precautions.  Risk Prescription drug management.           Final Clinical Impression(s) / ED Diagnoses Final diagnoses:  Strain of neck muscle, initial encounter    Rx / DC Orders ED Discharge Orders          Ordered    methocarbamol (ROBAXIN) 500 MG tablet  Every 6 hours PRN        01/16/23 2137     naproxen (NAPROSYN) 500 MG tablet  2 times daily        01/16/23 2138              Josem Kaufmann 01/16/23 2145    Carmel Sacramento A, PA-C  01/16/23 2146    Benjiman Core, MD 01/17/23 [phone]

## 2023-01-16 NOTE — Discharge Instructions (Signed)
A pleasure taking care of you today.  You were seen for pain in your right neck which is likely due to a muscle strain.  You are given prescription for muscle relaxer and an anti-inflammatory to help with pain.  Follow-up with your doctor, come back to the ER for new or worsening symptoms.

## 2023-02-27 ENCOUNTER — Other Ambulatory Visit: Payer: Self-pay | Admitting: Family Medicine

## 2023-02-27 DIAGNOSIS — Z1212 Encounter for screening for malignant neoplasm of rectum: Secondary | ICD-10-CM

## 2023-02-27 DIAGNOSIS — Z1211 Encounter for screening for malignant neoplasm of colon: Secondary | ICD-10-CM

## 2023-04-03 ENCOUNTER — Other Ambulatory Visit (HOSPITAL_COMMUNITY): Payer: Self-pay | Admitting: Family Medicine

## 2023-04-03 DIAGNOSIS — Z1231 Encounter for screening mammogram for malignant neoplasm of breast: Secondary | ICD-10-CM

## 2023-04-16 ENCOUNTER — Ambulatory Visit (HOSPITAL_COMMUNITY)
Admission: RE | Admit: 2023-04-16 | Discharge: 2023-04-16 | Disposition: A | Discharge status: Home / Self Care | Payer: 59 | Source: Ambulatory Visit | Attending: Family Medicine | Admitting: Family Medicine

## 2023-04-16 DIAGNOSIS — Z1231 Encounter for screening mammogram for malignant neoplasm of breast: Secondary | ICD-10-CM | POA: Diagnosis present

## 2023-07-12 ENCOUNTER — Other Ambulatory Visit: Payer: Self-pay | Admitting: Family Medicine

## 2023-07-12 DIAGNOSIS — J302 Other seasonal allergic rhinitis: Secondary | ICD-10-CM
